# Patient Record
Sex: Female | Born: 1970
Health system: Southern US, Community
[De-identification: ages and names within clinical notes are randomized; demographics above are authoritative.]

## PROBLEM LIST (undated history)

## (undated) DIAGNOSIS — I10 Essential (primary) hypertension: Secondary | ICD-10-CM

## (undated) DIAGNOSIS — M4800 Spinal stenosis, site unspecified: Secondary | ICD-10-CM

## (undated) DIAGNOSIS — K589 Irritable bowel syndrome without diarrhea: Secondary | ICD-10-CM

## (undated) DIAGNOSIS — J45909 Unspecified asthma, uncomplicated: Secondary | ICD-10-CM

## (undated) DIAGNOSIS — K219 Gastro-esophageal reflux disease without esophagitis: Secondary | ICD-10-CM

## (undated) DIAGNOSIS — M1990 Unspecified osteoarthritis, unspecified site: Secondary | ICD-10-CM

## (undated) DIAGNOSIS — IMO0001 Reserved for inherently not codable concepts without codable children: Secondary | ICD-10-CM

## (undated) DIAGNOSIS — R091 Pleurisy: Secondary | ICD-10-CM

## (undated) HISTORY — DX: Gastro-esophageal reflux disease without esophagitis: K21.9

## (undated) HISTORY — DX: Reserved for inherently not codable concepts without codable children: IMO0001

## (undated) HISTORY — DX: Essential (primary) hypertension: I10

## (undated) HISTORY — PX: HERNIA REPAIR: SHX51

## (undated) HISTORY — DX: Unspecified asthma, uncomplicated: J45.909

---

## 2001-05-03 ENCOUNTER — Emergency Department (HOSPITAL_COMMUNITY): Admission: EM | Admit: 2001-05-03 | Discharge: 2001-05-03 | Payer: Self-pay | Admitting: *Deleted

## 2001-05-03 ENCOUNTER — Encounter: Payer: Self-pay | Admitting: *Deleted

## 2001-12-01 ENCOUNTER — Ambulatory Visit (HOSPITAL_COMMUNITY): Admission: RE | Admit: 2001-12-01 | Discharge: 2001-12-01 | Payer: Self-pay | Admitting: Family Medicine

## 2001-12-01 ENCOUNTER — Encounter: Payer: Self-pay | Admitting: Family Medicine

## 2002-12-05 ENCOUNTER — Ambulatory Visit (HOSPITAL_COMMUNITY): Admission: RE | Admit: 2002-12-05 | Discharge: 2002-12-05 | Payer: Self-pay | Admitting: Urology

## 2002-12-29 ENCOUNTER — Emergency Department (HOSPITAL_COMMUNITY): Admission: EM | Admit: 2002-12-29 | Discharge: 2002-12-29 | Payer: Self-pay | Admitting: Internal Medicine

## 2003-10-31 ENCOUNTER — Ambulatory Visit (HOSPITAL_COMMUNITY): Admission: RE | Admit: 2003-10-31 | Discharge: 2003-10-31 | Payer: Self-pay | Admitting: Family Medicine

## 2007-03-29 ENCOUNTER — Other Ambulatory Visit: Admission: RE | Admit: 2007-03-29 | Discharge: 2007-03-29 | Payer: Self-pay | Admitting: Obstetrics and Gynecology

## 2007-04-16 ENCOUNTER — Ambulatory Visit (HOSPITAL_COMMUNITY): Admission: RE | Admit: 2007-04-16 | Discharge: 2007-04-16 | Payer: Self-pay | Admitting: Family Medicine

## 2007-05-04 ENCOUNTER — Ambulatory Visit (HOSPITAL_COMMUNITY): Admission: RE | Admit: 2007-05-04 | Discharge: 2007-05-04 | Payer: Self-pay | Admitting: Family Medicine

## 2007-06-14 ENCOUNTER — Ambulatory Visit (HOSPITAL_COMMUNITY): Admission: RE | Admit: 2007-06-14 | Discharge: 2007-06-14 | Payer: Self-pay | Admitting: Family Medicine

## 2008-04-12 ENCOUNTER — Other Ambulatory Visit: Admission: RE | Admit: 2008-04-12 | Discharge: 2008-04-12 | Payer: Self-pay | Admitting: Obstetrics and Gynecology

## 2008-04-17 ENCOUNTER — Ambulatory Visit (HOSPITAL_COMMUNITY): Admission: RE | Admit: 2008-04-17 | Discharge: 2008-04-17 | Payer: Self-pay | Admitting: Obstetrics & Gynecology

## 2008-04-18 ENCOUNTER — Ambulatory Visit (HOSPITAL_COMMUNITY): Admission: RE | Admit: 2008-04-18 | Discharge: 2008-04-18 | Payer: Self-pay | Admitting: Obstetrics & Gynecology

## 2009-01-15 ENCOUNTER — Ambulatory Visit (HOSPITAL_COMMUNITY): Admission: RE | Admit: 2009-01-15 | Discharge: 2009-01-15 | Payer: Self-pay | Admitting: Family Medicine

## 2009-01-22 ENCOUNTER — Emergency Department (HOSPITAL_COMMUNITY): Admission: EM | Admit: 2009-01-22 | Discharge: 2009-01-22 | Payer: Self-pay | Admitting: Emergency Medicine

## 2009-05-23 ENCOUNTER — Other Ambulatory Visit: Admission: RE | Admit: 2009-05-23 | Discharge: 2009-05-23 | Payer: Self-pay | Admitting: Obstetrics & Gynecology

## 2010-01-03 ENCOUNTER — Ambulatory Visit (HOSPITAL_COMMUNITY)
Admission: RE | Admit: 2010-01-03 | Discharge: 2010-01-03 | Payer: Self-pay | Source: Home / Self Care | Admitting: Family Medicine

## 2010-01-15 ENCOUNTER — Ambulatory Visit (HOSPITAL_COMMUNITY)
Admission: RE | Admit: 2010-01-15 | Discharge: 2010-01-15 | Payer: Self-pay | Source: Home / Self Care | Attending: Internal Medicine | Admitting: Internal Medicine

## 2010-02-23 ENCOUNTER — Encounter: Payer: Self-pay | Admitting: Urology

## 2010-09-03 ENCOUNTER — Other Ambulatory Visit (HOSPITAL_COMMUNITY)
Admission: RE | Admit: 2010-09-03 | Discharge: 2010-09-03 | Disposition: A | Discharge status: Home / Self Care | Payer: BC Managed Care – PPO | Source: Ambulatory Visit | Attending: Obstetrics & Gynecology | Admitting: Obstetrics & Gynecology

## 2010-09-03 DIAGNOSIS — Z124 Encounter for screening for malignant neoplasm of cervix: Secondary | ICD-10-CM | POA: Insufficient documentation

## 2010-09-03 DIAGNOSIS — R87810 Cervical high risk human papillomavirus (HPV) DNA test positive: Secondary | ICD-10-CM | POA: Insufficient documentation

## 2010-12-20 IMAGING — CR DG CHEST 2V
2 series · 2 of 2 positions shown · non-contrast
Comparison: 01/22/2009

CLINICAL DATA: Cough, congestion, fever.

CHEST - 2 VIEW

[view not recorded (1 of 2)]
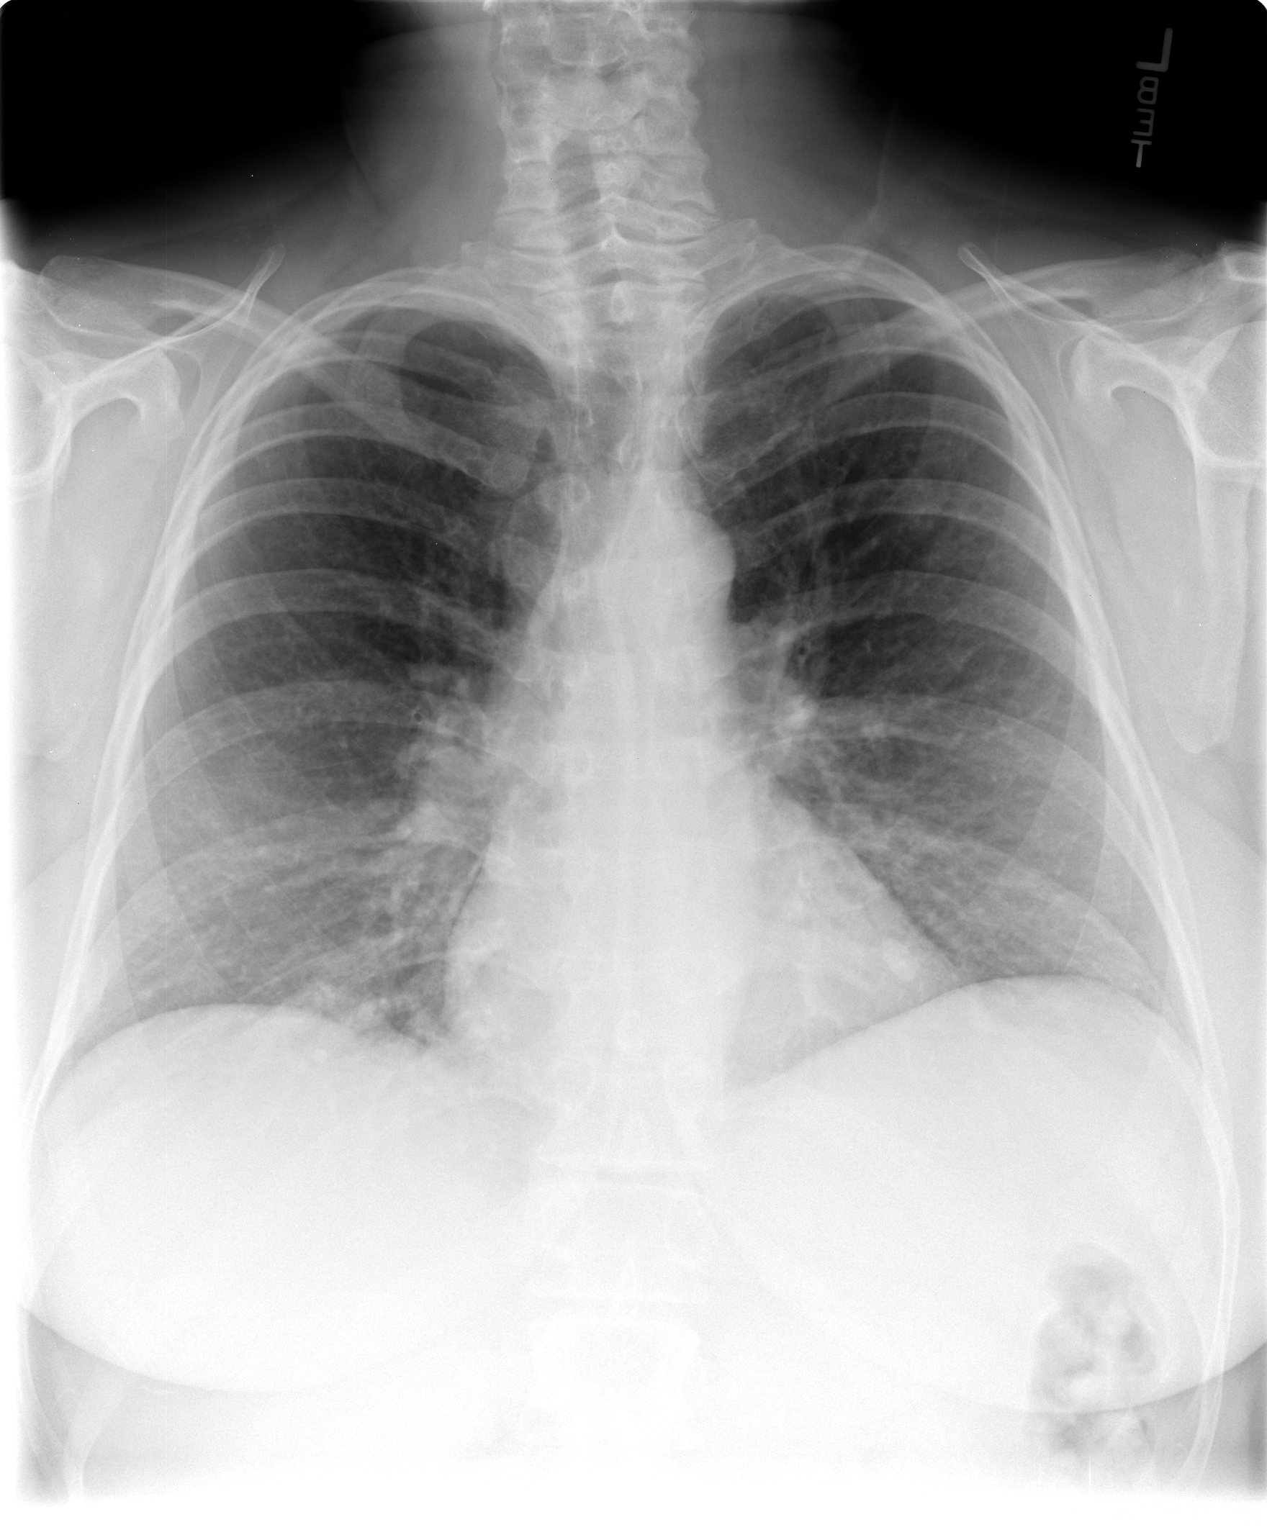

[view not recorded (2 of 2)]
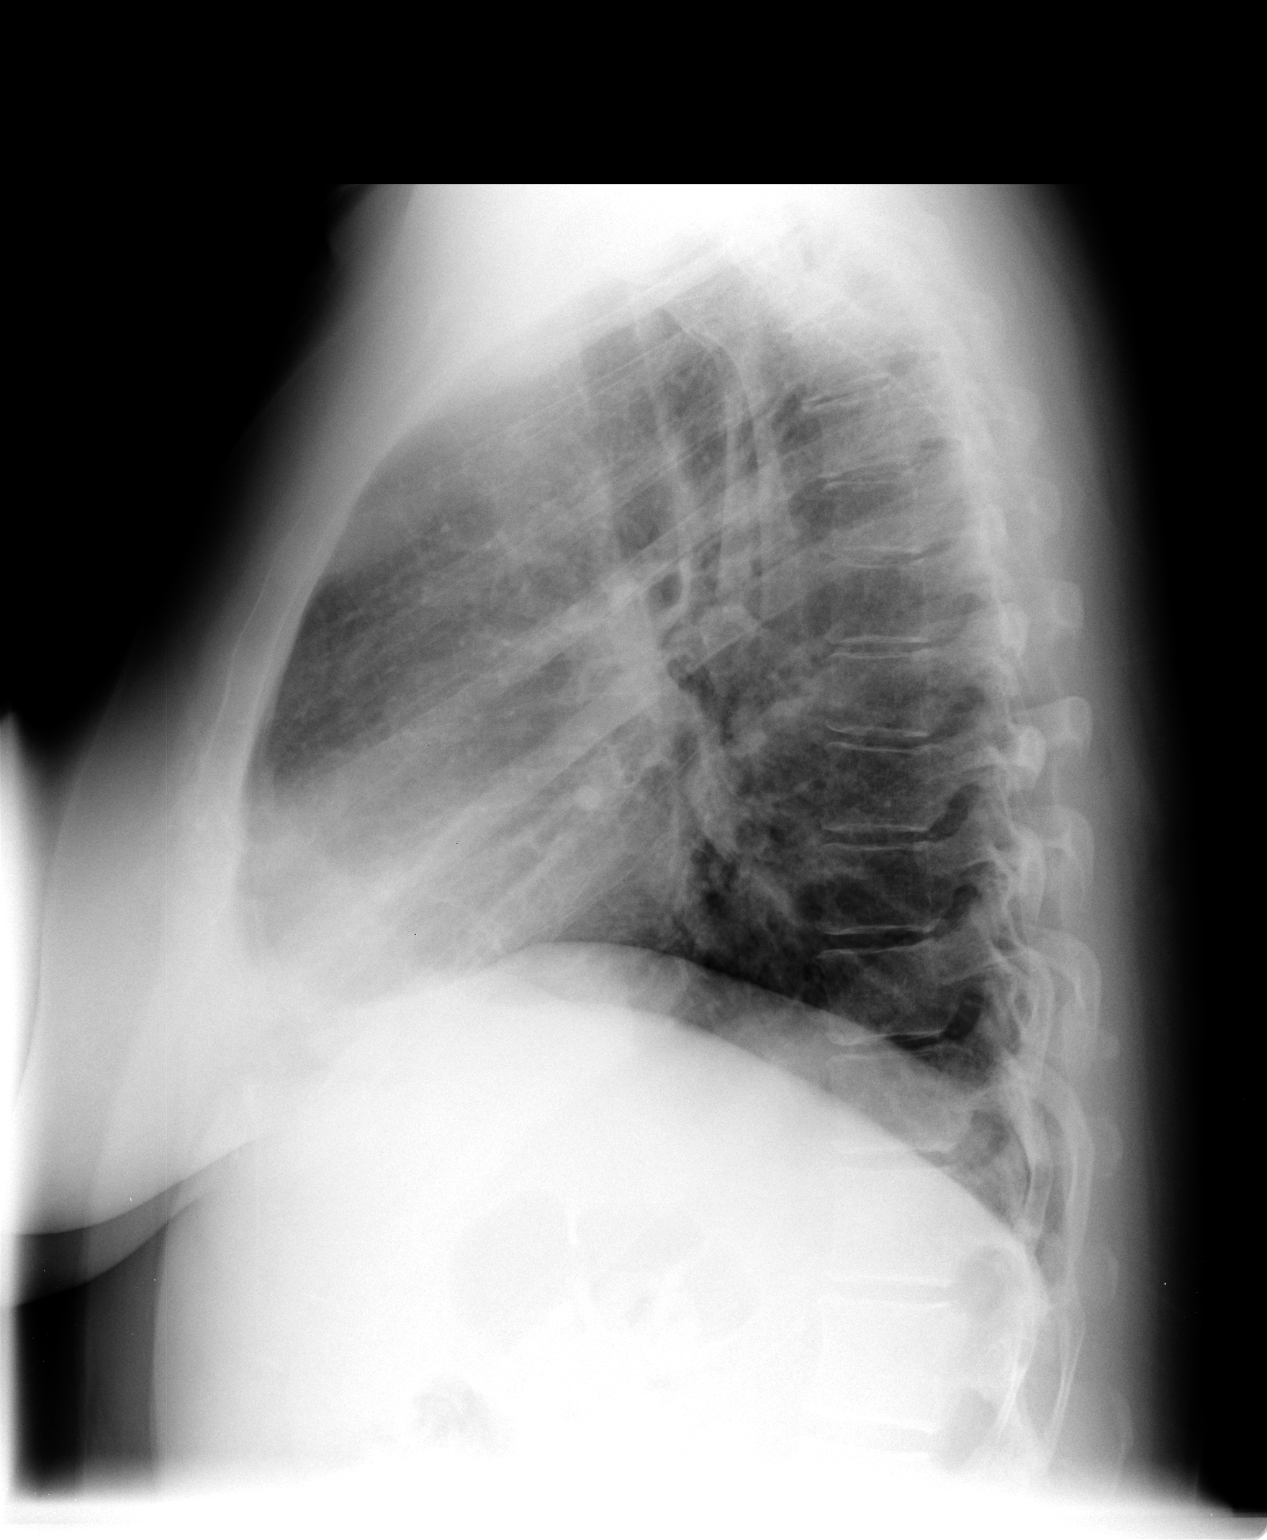

[2 of 2 positions shown; findings below may reference images not displayed]

FINDINGS: There is mild peribronchial thickening, similar to prior
study.  Heart and mediastinal contours are within normal limits.
No focal opacities or effusions.  No acute bony abnormality.
IMPRESSION: Mild bronchitic changes.

## 2011-09-15 ENCOUNTER — Other Ambulatory Visit (HOSPITAL_COMMUNITY): Payer: Self-pay | Admitting: Internal Medicine

## 2011-09-15 DIAGNOSIS — Z139 Encounter for screening, unspecified: Secondary | ICD-10-CM

## 2011-09-18 ENCOUNTER — Other Ambulatory Visit: Payer: Self-pay | Admitting: Obstetrics & Gynecology

## 2011-09-18 ENCOUNTER — Ambulatory Visit (HOSPITAL_COMMUNITY): Payer: BC Managed Care – PPO

## 2012-05-03 ENCOUNTER — Ambulatory Visit (INDEPENDENT_AMBULATORY_CARE_PROVIDER_SITE_OTHER): Payer: BC Managed Care – PPO | Admitting: Obstetrics & Gynecology

## 2012-05-03 ENCOUNTER — Encounter: Payer: Self-pay | Admitting: Obstetrics & Gynecology

## 2012-05-03 VITALS — BP 150/110 | Ht 66.0 in | Wt 193.0 lb

## 2012-05-03 DIAGNOSIS — Z01419 Encounter for gynecological examination (general) (routine) without abnormal findings: Secondary | ICD-10-CM

## 2012-05-03 DIAGNOSIS — R87810 Cervical high risk human papillomavirus (HPV) DNA test positive: Secondary | ICD-10-CM

## 2012-05-03 MED ORDER — NORETHINDRONE 0.35 MG PO TABS: 1.0000 | ORAL_TABLET | Freq: Every day | ORAL | Status: DC

## 2012-05-03 NOTE — Addendum Note (Signed)
Addended by: Lazaro Arms on: 05/03/2012 12:33 PM   Modules accepted: Orders

## 2012-05-03 NOTE — Progress Notes (Signed)
Patient ID: Ruth Dixon, female   DOB: [birth year], 41 y.o.   MRN: [id number] Subjective:     Ruth Dixon is a 42 y.o. female here for a routine exam.  Current complaints: none.  Personal health questionnaire reviewed: yes.   Gynecologic History Patient's last menstrual period was 04/27/2012. Contraception: oral progesterone-only contraceptive Last Pap: ASCUS +HPV . Results were: normal Last mammogram: 2010. Results were: normal  Obstetric History  G0 OB History   Grav Para Term Preterm Abortions TAB SAB Ect Mult Living                   The following portions of the patient's history were reviewed and updated as appropriate: allergies, current medications, past family history, past medical history, past social history, past surgical history and problem list.  Review of Systems    Review of Systems  Constitutional: Negative for fever, chills, weight loss, malaise/fatigue and diaphoresis.  HENT: Negative for hearing loss, ear pain, nosebleeds, congestion, sore throat, neck pain, tinnitus and ear discharge.   Eyes: Negative for blurred vision, double vision, photophobia, pain, discharge and redness.  Respiratory: Negative for cough, hemoptysis, sputum production, shortness of breath, wheezing and stridor.   Cardiovascular: Negative for chest pain, palpitations, orthopnea, claudication, leg swelling and PND.  Gastrointestinal: Negative for abdominal pain. Negative for heartburn, nausea, vomiting, diarrhea, constipation, blood in stool and melena.  Genitourinary: Negative for dysuria, urgency, frequency, hematuria and flank pain.  Musculoskeletal: Negative for myalgias, back pain, joint pain and falls.  Skin: Negative for itching and rash.  Neurological: Negative for dizziness, tingling, tremors, sensory change, speech change, focal weakness, seizures, loss of consciousness, weakness and headaches.  Endo/Heme/Allergies: Negative for environmental allergies and polydipsia. Does  not bruise/bleed easily.  Psychiatric/Behavioral: Negative for depression, suicidal ideas, hallucinations, memory loss and substance abuse. The patient is not nervous/anxious and does not have insomnia.       Objective:    BP 150/110  Ht 5\' 6"  (1.676 m)  Wt 193 lb (87.544 kg)  BMI 31.17 kg/m2  LMP 04/27/2012  General Appearance:    Alert, cooperative, no distress, appears stated age  Head:    Normocephalic, without obvious abnormality, atraumatic  Eyes:    PERRL, conjunctiva/corneas clear, EOM's intact, fundi    benign, both eyes  Ears:    Normal TM's and external ear canals, both ears  Nose:   Nares normal, septum midline, mucosa normal, no drainage    or sinus tenderness  Throat:   Lips, mucosa, and tongue normal; teeth and gums normal  Neck:   Supple, symmetrical, trachea midline, no adenopathy;    thyroid:  no enlargement/tenderness/nodules; no carotid   bruit or JVD  Back:     Symmetric, no curvature, ROM normal, no CVA tenderness  Lungs:     Clear to auscultation bilaterally, respirations unlabored  Chest Wall:    No tenderness or deformity   Heart:    Regular rate and rhythm, S1 and S2 normal, no murmur, rub   or gallop  Breast Exam:    No tenderness, masses, or nipple abnormality  Abdomen:     Soft, non-tender, bowel sounds active all four quadrants,    no masses, no organomegaly  Genitalia:    Normal female without lesion, discharge or tenderness  Rectal:    Normal tone, normal prostate, no masses or tenderness;   guaiac negative stool  Extremities:   Extremities normal, atraumatic, no cyanosis or edema  Pulses:   2+ and  symmetric all extremities  Skin:   Skin color, texture, turgor normal, no rashes or lesions  Lymph nodes:   Cervical, supraclavicular, and axillary nodes normal  Neurologic:   CNII-XII intact, normal strength, sensation and reflexes    throughout    vagina pink moist Uterus NSSC NT Ovaries WNL adnexa negative  Assessment:    Healthy female exam.     Plan:    Mammogram ordered. Follow up in: 1 year.

## 2012-05-03 NOTE — Patient Instructions (Signed)
Pelvic Exam A pelvic (gynecologic) exam is an exam of a woman's outer and inner genitals and reproductive organs. At age 44, or before a woman starts to have sexual intercourse, she should have her first pelvic exam. Pelvic exams allow your caregiver to check on normal development and screen for health problems. These exams should be done regularly throughout a woman's life. Usually, a general physical exam is done first. An exam of the breasts is also done. At this visit, you can ask questions about your health, body, menstrual cycles, sex, and birth control methods. Your caregiver will also ask you questions about your health, family health, menstrual periods, immunizations, and if you are sexually active. The information shared between you and your caregiver is kept confidential. REASONS FOR A PELVIC EXAM  Annual exam and Pap test. A Pap test removes cells from the cervix gently with a spatula and a small brush. The cells are tested for infection, precancer, and cancer.  A Pap test is done to screen for cervical cancer.  The first Pap test should be done at age 21.  Between ages 21 and 29, Pap tests are repeated every 2 years.  Beginning at age 30, you are advised to have a Pap test every 3 years as long as your past 3 Pap tests have been normal.  Some women have medical problems that increase the chance of getting cervical cancer. Talk to your caregiver about these problems. It is especially important to talk to your caregiver if a new problem develops soon after your last Pap test. In these cases, your caregiver may recommend more frequent screening and Pap tests.  The above recommendations are the same for women who have or have not gotten the vaccine for HPV (Human Papillomavirus).  If you had a hysterectomy for a problem that was not cancer or a condition that could lead to cancer, then you no longer need Pap tests. However, even if you no longer need a Pap test, a regular exam is a good  idea to make sure no other problems are starting.   If you are between ages 65 and 70, and you have had normal Pap tests going back 10 years, you no longer need Pap tests. However, even if you no longer need a Pap test, a regular exam is a good idea to make sure no other problems are starting.   If you have had past treatment for cervical cancer or a condition that could lead to cancer, you need Pap tests and screening for cancer for at least 20 years after your treatment.  If Pap tests have been discontinued, risk factors (such as a new sexual partner) need to be re-assessed to determine if screening should be resumed.  Some women may need screenings more often if they are at high risk for cervical cancer.  Make sure your female organs are normal and functioning correctly.  Evaluate a mass or other symptoms that suggest a reproductive system cancer.  Explore why you are not able to get pregnant (infertility).  Find a cause for vaginal discharge, itching, or burning.  Get certain types of birth control or start hormone therapy.  Look for causes of urinary incontinence or sexual problems.  Look for signs of sexually transmitted infection (STI).  Follow the progression of labor.  Determine if pregnancy is present or how far advanced the pregnancy is.  You have severe cramps during your menstrual period.  You have pain during sexual intercourse.  You have abnormal   menstrual periods.  You have no menstrual period by the age of 16. PROCEDURE   A pelvic exam is usually painless but may cause mild discomfort.  In unusual circumstances or in young girls, medicines may be used for comfort. A pelvic exam is not done routinely before a girl is sexually active. Special circumstances such as rape, trauma, or medical problems may require an exam.  You will remove all your clothes and will be given a gown. Usually, there is a nurse in the room during the exam and you can have someone  from your family with you also.  The general physical exam will be done first.  Before the pelvic exam starts, the woman lies down on her back on a special table. She puts the heels of her feet into foot rests (stirrups) with her legs apart. A gown, cloth, or paper drape is usually placed over her belly (abdomen) and legs. First, the caregiver checks the normal arrangement of body parts of the outer genitals. This includes the clitoris, vaginal opening, hymen, labia, and the perineal area between the vagina and rectum. The labia are the skin folds surrounding the vaginal opening. The tube that carries urine (urethra) is also examined.  An internal exam is done next. First, the caregiver inserts an instrument called a speculum into the vagina. The speculum has lubricant on it. The speculum helps hold the vaginal walls apart. The caregiver can then examine the vagina and cervix, which is the opening to the womb (uterus). Cultures of any discharge may be taken to check for an infection. A Pap test may be done.  After the internal exam is done, the speculum is removed. The caregiver uses latex gloves with a lubricant on the fingers to gently press against various pelvic organs from inside the vagina while the other hand is on the lower belly. The caregiver will note any tenderness or abnormalities.  If a pelvic exam is done on a woman who is thought to be in labor, her caregiver can check on the baby and how far her cervix has opened.  Following the exam, you will get dressed and can speak with your caregiver.  Ask your caregiver when and how often you should return for future visits. Finding out the results of your test Ask when your test results will be ready. Make sure you get your test results. TO HAVE A HEALTHY LIFESTYLE:  Follow your caregiver's advice regarding follow-up and future visits.  Get the necessary immunizations according to your age and any traveling you may do.  Eat a balanced,  nourishing diet.  Get plenty of rest and sleep.  Exercise regularly.  Maintain a healthy weight.  Do not smoke or take illegal drugs.  Drink alcohol in moderation or not at all.  If you are sexually active, use some form of birth control if you do not plan to get pregnant.  If you are sexually active, practice safe sex by using a condom to protect against sexually transmitted disease (STD).  Get help or counseling if you have emotional problems. Document Released: 04/12/2002 Document Revised: 04/14/2011 Document Reviewed: 04/18/2009 [employer] Patient Information 2013 [employer].  

## 2012-05-17 ENCOUNTER — Telehealth: Payer: Self-pay | Admitting: Obstetrics & Gynecology

## 2012-05-17 NOTE — Telephone Encounter (Signed)
Pt informed of WNL results from Pap on 05/03/2012

## 2012-05-17 NOTE — Telephone Encounter (Signed)
Left message to return call 

## 2013-06-08 ENCOUNTER — Other Ambulatory Visit: Payer: Self-pay | Admitting: Obstetrics & Gynecology

## 2013-12-06 ENCOUNTER — Ambulatory Visit (INDEPENDENT_AMBULATORY_CARE_PROVIDER_SITE_OTHER): Payer: BC Managed Care – PPO | Admitting: Urology

## 2013-12-06 DIAGNOSIS — R312 Other microscopic hematuria: Secondary | ICD-10-CM

## 2014-05-09 ENCOUNTER — Other Ambulatory Visit: Payer: Self-pay | Admitting: Obstetrics & Gynecology

## 2014-08-25 ENCOUNTER — Other Ambulatory Visit (HOSPITAL_COMMUNITY): Payer: Self-pay | Admitting: Physician Assistant

## 2014-08-25 DIAGNOSIS — Z1231 Encounter for screening mammogram for malignant neoplasm of breast: Secondary | ICD-10-CM

## 2014-09-11 ENCOUNTER — Ambulatory Visit (HOSPITAL_COMMUNITY): Payer: Self-pay

## 2014-12-19 ENCOUNTER — Emergency Department (HOSPITAL_COMMUNITY): Payer: BLUE CROSS/BLUE SHIELD

## 2014-12-19 ENCOUNTER — Encounter (HOSPITAL_COMMUNITY): Payer: Self-pay | Admitting: Emergency Medicine

## 2014-12-19 ENCOUNTER — Emergency Department (HOSPITAL_COMMUNITY)
Admission: EM | Admit: 2014-12-19 | Discharge: 2014-12-19 | Disposition: A | Discharge status: Home / Self Care | Payer: BLUE CROSS/BLUE SHIELD | Attending: Emergency Medicine | Admitting: Emergency Medicine

## 2014-12-19 DIAGNOSIS — F17210 Nicotine dependence, cigarettes, uncomplicated: Secondary | ICD-10-CM | POA: Insufficient documentation

## 2014-12-19 DIAGNOSIS — Z3202 Encounter for pregnancy test, result negative: Secondary | ICD-10-CM | POA: Diagnosis not present

## 2014-12-19 DIAGNOSIS — R091 Pleurisy: Secondary | ICD-10-CM | POA: Diagnosis not present

## 2014-12-19 DIAGNOSIS — R109 Unspecified abdominal pain: Secondary | ICD-10-CM | POA: Diagnosis present

## 2014-12-19 DIAGNOSIS — Z79899 Other long term (current) drug therapy: Secondary | ICD-10-CM | POA: Diagnosis not present

## 2014-12-19 DIAGNOSIS — J45909 Unspecified asthma, uncomplicated: Secondary | ICD-10-CM | POA: Diagnosis not present

## 2014-12-19 DIAGNOSIS — K219 Gastro-esophageal reflux disease without esophagitis: Secondary | ICD-10-CM | POA: Diagnosis not present

## 2014-12-19 DIAGNOSIS — J40 Bronchitis, not specified as acute or chronic: Secondary | ICD-10-CM

## 2014-12-19 DIAGNOSIS — I10 Essential (primary) hypertension: Secondary | ICD-10-CM | POA: Insufficient documentation

## 2014-12-19 DIAGNOSIS — R52 Pain, unspecified: Secondary | ICD-10-CM

## 2014-12-19 LAB — COMPREHENSIVE METABOLIC PANEL
ALT: 23 U/L (ref 14–54)
AST: 26 U/L (ref 15–41)
Albumin: 4 g/dL (ref 3.5–5.0)
Alkaline Phosphatase: 42 U/L (ref 38–126)
Anion gap: 9 (ref 5–15)
BUN: 10 mg/dL (ref 6–20)
CO2: 25 mmol/L (ref 22–32)
Calcium: 9.2 mg/dL (ref 8.9–10.3)
Chloride: 103 mmol/L (ref 101–111)
Creatinine, Ser: 0.53 mg/dL (ref 0.44–1.00)
GFR calc Af Amer: >60 mL/min (ref >60)
GFR calc non Af Amer: >60 mL/min (ref >60)
Glucose, Bld: 109 mg/dL — ABNORMAL HIGH (ref 65–99)
Potassium: 4.6 mmol/L (ref 3.5–5.1)
Sodium: 137 mmol/L (ref 135–145)
Total Bilirubin: 0.7 mg/dL (ref 0.3–1.2)
Total Protein: 7.3 g/dL (ref 6.5–8.1)

## 2014-12-19 LAB — URINALYSIS, ROUTINE W REFLEX MICROSCOPIC
Bilirubin Urine: NEGATIVE
Glucose, UA: NEGATIVE mg/dL
Ketones, ur: NEGATIVE mg/dL
Leukocytes, UA: NEGATIVE
Nitrite: NEGATIVE
Protein, ur: NEGATIVE mg/dL
Specific Gravity, Urine: 1.015 (ref 1.005–1.030)
Urobilinogen, UA: 0.2 mg/dL (ref 0.0–1.0)
pH: 8 (ref 5.0–8.0)

## 2014-12-19 LAB — CBC WITH DIFFERENTIAL/PLATELET
Basophils Absolute: 0 10*3/uL (ref 0.0–0.1)
Basophils Relative: 0 %
Eosinophils Absolute: 0.1 10*3/uL (ref 0.0–0.7)
Eosinophils Relative: 1 %
HCT: 42.4 % (ref 36.0–46.0)
Hemoglobin: 13.8 g/dL (ref 12.0–15.0)
Lymphocytes Relative: 30 %
Lymphs Abs: 3 10*3/uL (ref 0.7–4.0)
MCH: 28.7 pg (ref 26.0–34.0)
MCHC: 32.5 g/dL (ref 30.0–36.0)
MCV: 88.1 fL (ref 78.0–100.0)
Monocytes Absolute: 1 10*3/uL (ref 0.1–1.0)
Monocytes Relative: 10 %
Neutro Abs: 5.7 10*3/uL (ref 1.7–7.7)
Neutrophils Relative %: 59 %
Platelets: 434 10*3/uL — ABNORMAL HIGH (ref 150–400)
RBC: 4.81 MIL/uL (ref 3.87–5.11)
RDW: 13.7 % (ref 11.5–15.5)
WBC: 10 10*3/uL (ref 4.0–10.5)

## 2014-12-19 LAB — D-DIMER, QUANTITATIVE: D-Dimer, Quant: 0.34 ug/mL-FEU (ref 0.00–0.50)

## 2014-12-19 LAB — I-STAT TROPONIN, ED
TROPONIN I, POC: 0 ng/mL (ref 0.00–0.08)
TROPONIN I, POC: 0 ng/mL (ref 0.00–0.08)

## 2014-12-19 LAB — URINE MICROSCOPIC-ADD ON
Bacteria, UA: NONE SEEN
WBC, UA: NONE SEEN WBC/hpf (ref 0–5)

## 2014-12-19 LAB — POC URINE PREG, ED: PREG TEST UR: NEGATIVE

## 2014-12-19 MED ORDER — AZITHROMYCIN 250 MG PO TABS: ORAL_TABLET | ORAL | Status: DC

## 2014-12-19 MED ORDER — HYDROMORPHONE HCL 1 MG/ML IJ SOLN
1.0000 mg | Freq: Once | INTRAMUSCULAR | Status: AC
  Administered 2014-12-19: 1 mg via INTRAVENOUS
  Filled 2014-12-19: qty 1

## 2014-12-19 MED ORDER — NAPROXEN 500 MG PO TABS
500.0000 mg | ORAL_TABLET | Freq: Two times a day (BID) | ORAL | Status: DC
Start: 2014-12-19 — End: 2015-05-26

## 2014-12-19 MED ORDER — ONDANSETRON HCL 4 MG/2ML IJ SOLN
4.0000 mg | Freq: Once | INTRAMUSCULAR | Status: AC
  Administered 2014-12-19: 4 mg via INTRAVENOUS
  Filled 2014-12-19: qty 2

## 2014-12-19 MED ORDER — IOHEXOL 350 MG/ML SOLN
100.0000 mL | Freq: Once | INTRAVENOUS | Status: AC | PRN
  Administered 2014-12-19: 100 mL via INTRAVENOUS

## 2014-12-19 MED ORDER — OXYCODONE-ACETAMINOPHEN 5-325 MG PO TABS: 1.0000 | ORAL_TABLET | Freq: Four times a day (QID) | ORAL | Status: DC | PRN

## 2014-12-19 NOTE — ED Notes (Addendum)
Pt states "left flank pain" started Thursday, has progressively gotten worse. Pt denies hx of kidney stones. Pt denies N/V/, and urinary sx. This morning pt states urination provided relief. Pt is holding left rib area under breast and states that is where the pain is. Pt denies pain in back. Pt states she took two 10-325 Hydrocodone at home without relief.

## 2014-12-19 NOTE — ED Provider Notes (Signed)
CSN: [id number]     Arrival date & time 12/19/14  [id number] History  By signing my name below, I, Ruth Dixon, attest that this documentation has been prepared under the direction and in the presence of Bethann BerkshireJoseph Amiree No, MD. Electronically Signed: Marica OtterNusrat Dixon, ED Scribe. 12/19/2014. 9:04 AM.  Chief Complaint  Patient presents with  . Flank Pain   Patient is a 44 y.o. female presenting with flank pain. The history is provided by the patient. No language interpreter was used.  Flank Pain This is a new problem. The current episode started more than 2 days ago. The problem occurs constantly. The problem has been gradually worsening. Pertinent negatives include no chest pain, no abdominal pain and no headaches.   PCP: Colette RibasGOLDING, JOHN CABOT, MD HPI Comments: Ruth Griffonngela W Dixon is a 44 y.o. female, with PMHx noted below, who presents to the Emergency Department complaining of worsening, severe, stabbing, left flank pain originally onset 5 days ago and worsening in the last 24 hours. Pt denies Hx of kidney stones. Pt notes temporary relief after voiding this morning. Pt further denies back pain, nausea, vomiting or any urinary Sx.   Past Medical History  Diagnosis Date  . Reflux   . Asthma   . Hypertension    Past Surgical History  Procedure Laterality Date  . Hernia repair     Family History  Problem Relation Age of Onset  . Diabetes Maternal Grandmother   . COPD Maternal Grandmother   . Stroke Maternal Grandmother   . COPD Maternal Grandfather   . COPD Paternal Grandmother   . COPD Paternal Grandfather    Social History  Substance Use Topics  . Smoking status: Current Every Day Smoker -- 1.00 packs/day    Types: Cigarettes  . Smokeless tobacco: None  . Alcohol Use: Yes     Comment: occ wine for supper   OB History    No data available     Review of Systems  Constitutional: Negative for appetite change and fatigue.  HENT: Negative for congestion, ear discharge and sinus pressure.    Eyes: Negative for discharge.  Respiratory: Negative for cough.   Cardiovascular: Negative for chest pain.  Gastrointestinal: Negative for nausea, vomiting, abdominal pain and diarrhea.  Genitourinary: Positive for flank pain. Negative for dysuria, frequency, hematuria and difficulty urinating.  Musculoskeletal: Negative for back pain.  Skin: Negative for rash.  Neurological: Negative for seizures and headaches.  Psychiatric/Behavioral: Negative for hallucinations.   Allergies  Review of patient's allergies indicates no known allergies.  Home Medications   Prior to Admission medications   Medication Sig Start Date End Date Taking? Authorizing Provider  amLODipine (NORVASC) 10 MG tablet Take by mouth daily. 04/11/12   Historical Provider, MD  ERRIN 0.35 MG tablet TAKE AS DIRECTED BY DOCTOR 05/09/14   Lazaro ArmsLuther H Eure, MD  fish oil-omega-3 fatty acids 1000 MG capsule Take 2 g by mouth daily.    Historical Provider, MD  FLUoxetine (PROZAC) 40 MG capsule Take 40 mg by mouth daily.    Historical Provider, MD  HYDROcodone-acetaminophen (NORCO) 10-325 MG per tablet daily. 04/12/12   Historical Provider, MD  lansoprazole (PREVACID) 15 MG capsule Take 15 mg by mouth daily.    Historical Provider, MD  lisinopril (PRINIVIL,ZESTRIL) 10 MG tablet daily. 04/11/12   Historical Provider, MD   Triage Vitals: BP 144/102 mmHg  Pulse 108  Temp(Src) 98.2 F (36.8 C) (Oral)  Resp 20  Ht 5\' 7"  (1.702 m)  Wt 200 lb (90.719  kg)  BMI 31.32 kg/m2  SpO2 92%  LMP 07/19/2014 Physical Exam  Constitutional: She is oriented to person, place, and time. She appears well-developed.  HENT:  Head: Normocephalic.  Eyes: Conjunctivae and EOM are normal. No scleral icterus.  Neck: Neck supple. No thyromegaly present.  Cardiovascular: Normal rate and regular rhythm.  Exam reveals no gallop and no friction rub.   No murmur heard. Pulmonary/Chest: No stridor. She has no wheezes. She has no rales. She exhibits tenderness.   Tender to anterior left lower chest  Abdominal: She exhibits no distension. There is no tenderness. There is no rebound.  Musculoskeletal: Normal range of motion. She exhibits no edema.  Lymphadenopathy:    She has no cervical adenopathy.  Neurological: She is oriented to person, place, and time. She exhibits normal muscle tone. Coordination normal.  Skin: No rash noted. No erythema.  Psychiatric: She has a normal mood and affect. Her behavior is normal.    ED Course  Procedures (including critical care time) DIAGNOSTIC STUDIES: Oxygen Saturation is 92% on ra, low by my interpretation.    COORDINATION OF CARE: 9:01 AM: Discussed treatment plan which includes labs and meds with pt at bedside; patient verbalizes understanding and agrees with treatment plan.  Labs Review Labs Reviewed - No data to display I have personally reviewed and evaluated these lab results as part of my medical decision-making.  MDM   Final diagnoses:  None   CT chest CT abdomen negative chest x-ray shows bronchitis labs unremarkable. Suspect bronchitis with severe pleurisy we'll treat with Naprosyn Percocet and a Z-Pak patient is follow-up Thursday with her M.D.  The chart was scribed for me under my direct supervision.  I personally performed the history, physical, and medical decision making and all procedures in the evaluation of this patient.Bethann Berkshire, MD 12/19/14 [phone]

## 2014-12-19 NOTE — Discharge Instructions (Signed)
Follow up with your md Thursday as planned

## 2014-12-19 NOTE — ED Notes (Signed)
MD at bedside. 

## 2015-04-27 ENCOUNTER — Other Ambulatory Visit: Payer: Self-pay | Admitting: Obstetrics & Gynecology

## 2015-05-26 ENCOUNTER — Encounter (HOSPITAL_COMMUNITY): Payer: Self-pay | Admitting: Emergency Medicine

## 2015-05-26 ENCOUNTER — Emergency Department (HOSPITAL_COMMUNITY)
Admission: EM | Admit: 2015-05-26 | Discharge: 2015-05-26 | Disposition: A | Discharge status: Home / Self Care | Payer: BLUE CROSS/BLUE SHIELD | Attending: Emergency Medicine | Admitting: Emergency Medicine

## 2015-05-26 ENCOUNTER — Emergency Department (HOSPITAL_COMMUNITY): Payer: BLUE CROSS/BLUE SHIELD

## 2015-05-26 DIAGNOSIS — F17210 Nicotine dependence, cigarettes, uncomplicated: Secondary | ICD-10-CM | POA: Insufficient documentation

## 2015-05-26 DIAGNOSIS — R112 Nausea with vomiting, unspecified: Secondary | ICD-10-CM | POA: Diagnosis not present

## 2015-05-26 DIAGNOSIS — Z79899 Other long term (current) drug therapy: Secondary | ICD-10-CM | POA: Insufficient documentation

## 2015-05-26 DIAGNOSIS — I10 Essential (primary) hypertension: Secondary | ICD-10-CM | POA: Insufficient documentation

## 2015-05-26 DIAGNOSIS — R197 Diarrhea, unspecified: Secondary | ICD-10-CM | POA: Diagnosis not present

## 2015-05-26 LAB — COMPREHENSIVE METABOLIC PANEL
ALK PHOS: 49 U/L (ref 38–126)
ALT: 29 U/L (ref 14–54)
AST: 29 U/L (ref 15–41)
Albumin: 4.6 g/dL (ref 3.5–5.0)
Anion gap: 16 — ABNORMAL HIGH (ref 5–15)
BUN: 14 mg/dL (ref 6–20)
CALCIUM: 9.8 mg/dL (ref 8.9–10.3)
CHLORIDE: 100 mmol/L — AB (ref 101–111)
CO2: 21 mmol/L — AB (ref 22–32)
Creatinine, Ser: 0.71 mg/dL (ref 0.44–1.00)
GFR calc non Af Amer: >60 mL/min (ref >60)
GLUCOSE: 137 mg/dL — AB (ref 65–99)
Potassium: 3.7 mmol/L (ref 3.5–5.1)
SODIUM: 137 mmol/L (ref 135–145)
Total Bilirubin: 1.2 mg/dL (ref 0.3–1.2)
Total Protein: 8.3 g/dL — ABNORMAL HIGH (ref 6.5–8.1)

## 2015-05-26 LAB — CBC
HEMATOCRIT: 47.8 % — AB (ref 36.0–46.0)
HEMOGLOBIN: 16.3 g/dL — AB (ref 12.0–15.0)
MCH: 29 pg (ref 26.0–34.0)
MCHC: 34.1 g/dL (ref 30.0–36.0)
MCV: 85.1 fL (ref 78.0–100.0)
Platelets: 414 10*3/uL — ABNORMAL HIGH (ref 150–400)
RBC: 5.62 MIL/uL — AB (ref 3.87–5.11)
RDW: 14.2 % (ref 11.5–15.5)
WBC: 16.2 10*3/uL — ABNORMAL HIGH (ref 4.0–10.5)

## 2015-05-26 LAB — URINALYSIS, ROUTINE W REFLEX MICROSCOPIC
Glucose, UA: NEGATIVE mg/dL
Leukocytes, UA: NEGATIVE
Nitrite: NEGATIVE
PH: 6 (ref 5.0–8.0)
Protein, ur: 30 mg/dL — AB
SPECIFIC GRAVITY, URINE: 1.01 (ref 1.005–1.030)

## 2015-05-26 LAB — URINE MICROSCOPIC-ADD ON

## 2015-05-26 LAB — PREGNANCY, URINE: PREG TEST UR: NEGATIVE

## 2015-05-26 LAB — LIPASE, BLOOD: Lipase: 22 U/L (ref 11–51)

## 2015-05-26 MED ORDER — DICYCLOMINE HCL 20 MG PO TABS: 20.0000 mg | ORAL_TABLET | Freq: Four times a day (QID) | ORAL | Status: DC | PRN

## 2015-05-26 MED ORDER — DICYCLOMINE HCL 10 MG/ML IM SOLN
20.0000 mg | Freq: Once | INTRAMUSCULAR | Status: AC
  Administered 2015-05-26: 20 mg via INTRAMUSCULAR
  Filled 2015-05-26: qty 2

## 2015-05-26 MED ORDER — ONDANSETRON 4 MG PO TBDP: 4.0000 mg | ORAL_TABLET | Freq: Three times a day (TID) | ORAL | Status: DC | PRN

## 2015-05-26 MED ORDER — FAMOTIDINE IN NACL 20-0.9 MG/50ML-% IV SOLN
20.0000 mg | Freq: Once | INTRAVENOUS | Status: AC
  Administered 2015-05-26: 20 mg via INTRAVENOUS
  Filled 2015-05-26: qty 50

## 2015-05-26 MED ORDER — SODIUM CHLORIDE 0.9 % IV SOLN: INTRAVENOUS | Status: DC

## 2015-05-26 MED ORDER — ONDANSETRON HCL 4 MG/2ML IJ SOLN
4.0000 mg | INTRAMUSCULAR | Status: DC | PRN
  Administered 2015-05-26: 4 mg via INTRAVENOUS
  Filled 2015-05-26: qty 2

## 2015-05-26 MED ORDER — SODIUM CHLORIDE 0.9 % IV BOLUS (SEPSIS)
1000.0000 mL | Freq: Once | INTRAVENOUS | Status: AC
  Administered 2015-05-26: 1000 mL via INTRAVENOUS

## 2015-05-26 NOTE — ED Notes (Addendum)
Pt states she feeling much better.Does not want abd xray.  Requesting to be discharged. Dr Richrd PrimeMcmannus notified and instructed to give  Fluid challenge  Patient given water to see if she tolerates po fluids

## 2015-05-26 NOTE — ED Notes (Signed)
Pt tolerated po fluids. No further vomitng

## 2015-05-26 NOTE — ED Provider Notes (Signed)
CSN: [id number]     Arrival date & time 05/26/15  1502 History   First MD Initiated Contact with Patient 05/26/15 1523     Chief Complaint  Patient presents with  . Emesis     HPI  Pt was seen at 1535. Per pt, c/o gradual onset and persistence of multiple intermittent episodes of N/V/D that began overnight last night approximately 0230. Describes the stools as "watery." Has been associated with generalized abd "cramping." States multiple others at work with similar symptoms. Denies abd pain, no CP/SOB, no back pain, no fevers, no black or blood in stools or emesis.    Past Medical History  Diagnosis Date  . Reflux   . Asthma   . Hypertension    Past Surgical History  Procedure Laterality Date  . Hernia repair     Family History  Problem Relation Age of Onset  . Diabetes Maternal Grandmother   . COPD Maternal Grandmother   . Stroke Maternal Grandmother   . COPD Maternal Grandfather   . COPD Paternal Grandmother   . COPD Paternal Grandfather    Social History  Substance Use Topics  . Smoking status: Current Every Day Smoker -- 1.00 packs/day    Types: Cigarettes  . Smokeless tobacco: None  . Alcohol Use: Yes     Comment: occ wine for supper    Review of Systems ROS: Statement: All systems negative except as marked or noted in the HPI; Constitutional: Negative for fever and chills. ; ; Eyes: Negative for eye pain, redness and discharge. ; ; ENMT: Negative for ear pain, hoarseness, nasal congestion, sinus pressure and sore throat. ; ; Cardiovascular: Negative for chest pain, palpitations, diaphoresis, dyspnea and peripheral edema. ; ; Respiratory: Negative for cough, wheezing and stridor. ; ; Gastrointestinal: +N/V/D, abd cramping. Negative for abdominal pain, blood in stool, hematemesis, jaundice and rectal bleeding. . ; ; Genitourinary: Negative for dysuria, flank pain and hematuria. ; ; Musculoskeletal: Negative for back pain and neck pain. Negative for swelling and trauma.; ;  Skin: Negative for pruritus, rash, abrasions, blisters, bruising and skin lesion.; ; Neuro: Negative for headache, lightheadedness and neck stiffness. Negative for weakness, altered level of consciousness , altered mental status, extremity weakness, paresthesias, involuntary movement, seizure and syncope.      Allergies  Review of patient's allergies indicates no known allergies.  Home Medications   Prior to Admission medications   Medication Sig Start Date End Date Taking? Authorizing Provider  amLODipine (NORVASC) 10 MG tablet Take 10 mg by mouth daily.  04/11/12   Historical Provider, MD  azithromycin (ZITHROMAX Z-PAK) 250 MG tablet 2 po day one, then 1 daily x 4 days 12/19/14   Bethann Berkshire, MD  ERRIN 0.35 MG tablet TAKE AS DIRECTED BY DOCTOR 04/27/15   Lazaro Arms, MD  fish oil-omega-3 fatty acids 1000 MG capsule Take 2 g by mouth daily.    Historical Provider, MD  FLUoxetine (PROZAC) 40 MG capsule Take 40 mg by mouth daily.    Historical Provider, MD  HYDROcodone-acetaminophen (NORCO) 10-325 MG per tablet Take 1-2 tablets by mouth every 6 (six) hours as needed for moderate pain.  04/12/12   Historical Provider, MD  lansoprazole (PREVACID) 15 MG capsule Take 15 mg by mouth daily.    Historical Provider, MD  lisinopril (PRINIVIL,ZESTRIL) 10 MG tablet Take 10 mg by mouth daily.  04/11/12   Historical Provider, MD  naproxen (NAPROSYN) 500 MG tablet Take 1 tablet (500 mg total) by mouth 2 (  two) times daily. 12/19/14   Bethann Berkshire, MD  oxyCODONE-acetaminophen (PERCOCET/ROXICET) 5-325 MG tablet Take 1 tablet by mouth every 6 (six) hours as needed. 12/19/14   Bethann Berkshire, MD   BP 114/96 mmHg  Pulse 122  Temp(Src) 96.7 F (35.9 C) (Temporal)  Resp 20  Ht  (1.702 m)  Wt 204 lb (92.534 kg)  BMI 31.94 kg/m2  SpO2 100%  LMP 04/21/2015 Physical Exam  1540: Physical examination:  Nursing notes reviewed; Vital signs and O2 SAT reviewed;  Constitutional: Well developed, Well nourished,  In no acute distress; Head:  Normocephalic, atraumatic; Eyes: EOMI, PERRL, No scleral icterus; ENMT: Mouth and pharynx normal, Mucous membranes dry; Neck: Supple, Full range of motion, No lymphadenopathy; Cardiovascular: Tachycardic rate and rhythm, No murmur, rub, or gallop; Respiratory: Breath sounds clear & equal bilaterally, No rales, rhonchi, wheezes.  Speaking full sentences with ease, Normal respiratory effort/excursion; Chest: Nontender, Movement normal; Abdomen: Soft, +mild diffuse tenderness to palp. No rebound or guarding. Nondistended, Normal bowel sounds; Genitourinary: No CVA tenderness; Extremities: Pulses normal, No tenderness, No edema, No calf edema or asymmetry.; Neuro: AA&Ox3, Major CN grossly intact.  Speech clear. No gross focal motor or sensory deficits in extremities.; Skin: Color normal, Warm, Dry.   ED Course  Procedures (including critical care time) Labs Review  Imaging Review  I have personally reviewed and evaluated these images and lab results as part of my medical decision-making.   EKG Interpretation None      MDM  MDM Reviewed: previous chart, nursing note and vitals Reviewed previous: labs Interpretation: labs     Results for orders placed or performed during the hospital encounter of 05/26/15  Lipase, blood  Result Value Ref Range   Lipase 22 11 - 51 U/L  Comprehensive metabolic panel  Result Value Ref Range   Sodium 137 135 - 145 mmol/L   Potassium 3.7 3.5 - 5.1 mmol/L   Chloride 100 (L) 101 - 111 mmol/L   CO2 21 (L) 22 - 32 mmol/L   Glucose, Bld 137 (H) 65 - 99 mg/dL   BUN 14 6 - 20 mg/dL   Creatinine, Ser 1.61 0.44 - 1.00 mg/dL   Calcium 9.8 8.9 - 09.6 mg/dL   Total Protein 8.3 (H) 6.5 - 8.1 g/dL   Albumin 4.6 3.5 - 5.0 g/dL   AST 29 15 - 41 U/L   ALT 29 14 - 54 U/L   Alkaline Phosphatase 49 38 - 126 U/L   Total Bilirubin 1.2 0.3 - 1.2 mg/dL   GFR calc non Af Amer >60 >60 mL/min   GFR calc Af Amer >60 >60 mL/min   Anion gap 16 (H) 5  - 15  CBC  Result Value Ref Range   WBC 16.2 (H) 4.0 - 10.5 K/uL   RBC 5.62 (H) 3.87 - 5.11 MIL/uL   Hemoglobin 16.3 (H) 12.0 - 15.0 g/dL   HCT 04.5 (H) 40.9 - 81.1 %   MCV 85.1 78.0 - 100.0 fL   MCH 29.0 26.0 - 34.0 pg   MCHC 34.1 30.0 - 36.0 g/dL   RDW 91.4 78.2 - 95.6 %   Platelets 414 (H) 150 - 400 K/uL  Urinalysis, Routine w reflex microscopic (not at [facility])  Result Value Ref Range   Color, Urine YELLOW YELLOW   APPearance HAZY (A) CLEAR   Specific Gravity, Urine 1.010 1.005 - 1.030   pH 6.0 5.0 - 8.0   Glucose, UA NEGATIVE NEGATIVE mg/dL   Hgb urine dipstick  LARGE (A) NEGATIVE   Bilirubin Urine SMALL (A) NEGATIVE   Ketones, ur TRACE (A) NEGATIVE mg/dL   Protein, ur 30 (A) NEGATIVE mg/dL   Nitrite NEGATIVE NEGATIVE   Leukocytes, UA NEGATIVE NEGATIVE  Pregnancy, urine  Result Value Ref Range   Preg Test, Ur NEGATIVE NEGATIVE  Urine microscopic-add on  Result Value Ref Range   Squamous Epithelial / LPF 6-30 (A) NONE SEEN   WBC, UA 0-5 0 - 5 WBC/hpf   RBC / HPF 6-30 0 - 5 RBC/hpf   Bacteria, UA MANY (A) NONE SEEN    1755:  Udip appears contaminated, and pt denies dysuria. Pt has tol PO well while in the ED without N/V.  No stooling while in the ED. Pt has ambulated with steady gait, easy resps, NAD. Abd benign, VSS. Pt states she feels better, has gotten herself dressed, and wants to go home now. Refuses AXR or any other testing/treatment in the ED. Dx and testing d/w pt and family.  Questions answered.  Verb understanding, agreeable to d/c home with outpt f/u.      Samuel JesterKathleen Ireoluwa Gorsline, DO 05/30/15 1455

## 2015-05-26 NOTE — Discharge Instructions (Signed)
Take the prescriptions as directed.  Increase your fluid intake (ie:  Gatoraide) for the next few days, as discussed.  Eat a bland diet and advance to your regular diet slowly as you can tolerate it.   Avoid full strength juices, as well as milk and milk products until your diarrhea has resolved.   Call your regular medical doctor Monday to schedule a follow up appointment in the next 2 days.  Return to the Emergency Department immediately if not improving (or even worsening) despite taking the medicines as prescribed, any black or bloody stool or vomit, if you develop a fever over "101," or for any other concerns.

## 2015-05-26 NOTE — ED Notes (Signed)
Pt states she has been vomiting with diarrhea since about 0230 this morning.

## 2017-01-21 ENCOUNTER — Other Ambulatory Visit (HOSPITAL_COMMUNITY): Payer: Self-pay | Admitting: Family Medicine

## 2017-01-21 ENCOUNTER — Ambulatory Visit (HOSPITAL_COMMUNITY)
Admission: RE | Admit: 2017-01-21 | Discharge: 2017-01-21 | Disposition: A | Discharge status: Home / Self Care | Payer: Managed Care, Other (non HMO) | Source: Ambulatory Visit | Attending: Family Medicine | Admitting: Family Medicine

## 2017-01-21 DIAGNOSIS — R0600 Dyspnea, unspecified: Secondary | ICD-10-CM | POA: Diagnosis not present

## 2017-03-21 ENCOUNTER — Emergency Department (HOSPITAL_COMMUNITY)
Admission: EM | Admit: 2017-03-21 | Discharge: 2017-03-21 | Disposition: A | Discharge status: Home / Self Care | Payer: Managed Care, Other (non HMO) | Attending: Emergency Medicine | Admitting: Emergency Medicine

## 2017-03-21 ENCOUNTER — Other Ambulatory Visit: Payer: Self-pay

## 2017-03-21 ENCOUNTER — Emergency Department (HOSPITAL_COMMUNITY): Payer: Managed Care, Other (non HMO)

## 2017-03-21 ENCOUNTER — Encounter (HOSPITAL_COMMUNITY): Payer: Self-pay | Admitting: Emergency Medicine

## 2017-03-21 DIAGNOSIS — W010XXA Fall on same level from slipping, tripping and stumbling without subsequent striking against object, initial encounter: Secondary | ICD-10-CM | POA: Insufficient documentation

## 2017-03-21 DIAGNOSIS — Y999 Unspecified external cause status: Secondary | ICD-10-CM | POA: Insufficient documentation

## 2017-03-21 DIAGNOSIS — J45909 Unspecified asthma, uncomplicated: Secondary | ICD-10-CM | POA: Insufficient documentation

## 2017-03-21 DIAGNOSIS — I10 Essential (primary) hypertension: Secondary | ICD-10-CM | POA: Insufficient documentation

## 2017-03-21 DIAGNOSIS — S2232XA Fracture of one rib, left side, initial encounter for closed fracture: Secondary | ICD-10-CM | POA: Insufficient documentation

## 2017-03-21 DIAGNOSIS — F17210 Nicotine dependence, cigarettes, uncomplicated: Secondary | ICD-10-CM | POA: Insufficient documentation

## 2017-03-21 DIAGNOSIS — Z79899 Other long term (current) drug therapy: Secondary | ICD-10-CM | POA: Insufficient documentation

## 2017-03-21 DIAGNOSIS — Y939 Activity, unspecified: Secondary | ICD-10-CM | POA: Diagnosis not present

## 2017-03-21 DIAGNOSIS — Y929 Unspecified place or not applicable: Secondary | ICD-10-CM | POA: Insufficient documentation

## 2017-03-21 DIAGNOSIS — R52 Pain, unspecified: Secondary | ICD-10-CM

## 2017-03-21 DIAGNOSIS — S29001A Unspecified injury of muscle and tendon of front wall of thorax, initial encounter: Secondary | ICD-10-CM | POA: Diagnosis present

## 2017-03-21 HISTORY — DX: Pleurisy: R09.1

## 2017-03-21 LAB — BASIC METABOLIC PANEL
Anion gap: 13 (ref 5–15)
BUN: 10 mg/dL (ref 6–20)
CALCIUM: 9 mg/dL (ref 8.9–10.3)
CO2: 25 mmol/L (ref 22–32)
CREATININE: 0.54 mg/dL (ref 0.44–1.00)
Chloride: 98 mmol/L — ABNORMAL LOW (ref 101–111)
Glucose, Bld: 126 mg/dL — ABNORMAL HIGH (ref 65–99)
Potassium: 3.6 mmol/L (ref 3.5–5.1)
SODIUM: 136 mmol/L (ref 135–145)

## 2017-03-21 LAB — CBC
HCT: 42.1 % (ref 36.0–46.0)
Hemoglobin: 13.5 g/dL (ref 12.0–15.0)
MCH: 29.9 pg (ref 26.0–34.0)
MCHC: 32.1 g/dL (ref 30.0–36.0)
MCV: 93.1 fL (ref 78.0–100.0)
PLATELETS: 372 10*3/uL (ref 150–400)
RBC: 4.52 MIL/uL (ref 3.87–5.11)
RDW: 14 % (ref 11.5–15.5)
WBC: 8.6 10*3/uL (ref 4.0–10.5)

## 2017-03-21 MED ORDER — MORPHINE SULFATE (PF) 2 MG/ML IV SOLN
2.0000 mg | Freq: Once | INTRAVENOUS | Status: AC
  Administered 2017-03-21: 2 mg via INTRAVENOUS
  Filled 2017-03-21: qty 1

## 2017-03-21 MED ORDER — SODIUM CHLORIDE 0.9 % IV BOLUS (SEPSIS)
1000.0000 mL | Freq: Once | INTRAVENOUS | Status: AC
  Administered 2017-03-21: 1000 mL via INTRAVENOUS

## 2017-03-21 MED ORDER — IBUPROFEN 800 MG PO TABS
800.0000 mg | ORAL_TABLET | Freq: Once | ORAL | Status: AC
  Administered 2017-03-21: 800 mg via ORAL
  Filled 2017-03-21: qty 1

## 2017-03-21 NOTE — ED Provider Notes (Signed)
[facility] EMERGENCY DEPARTMENT Provider Note   CSN: [id number] Arrival date & time: 03/21/17  0709     History   Chief Complaint Chief Complaint  Patient presents with  . Fall    rib pain    HPI Ruth Dixon is a 47 y.o. female.  HPI  47 year old female presents today complaining of left sided chest wall pain after fall last night.  She states that she slipped on a hardwood floor and landed on the left lower chest area.  He initially was not too painful.  She awoke with severe pain in the area.  It is worse with movement and deep breathing.  It makes her feel short of breath.  She denies any other injury.  She did not strike her head and is not having neck pain.  She denies being on blood blood thinners.  Not noted any other injuries.  She took 2 hydrocodone with little relief.  Past Medical History:  Diagnosis Date  . Asthma   . Hypertension   . Pleurisy    multiple times  . Reflux     There are no active problems to display for this patient.   Past Surgical History:  Procedure Laterality Date  . HERNIA REPAIR      OB History    No data available       Home Medications    Prior to Admission medications   Medication Sig Start Date End Date Taking? Authorizing Provider  amLODipine (NORVASC) 10 MG tablet Take 10 mg by mouth daily.  04/11/12   [provider]  dicyclomine (BENTYL) 20 MG tablet Take 1 tablet (20 mg total) by mouth every 6 (six) hours as needed for spasms (abdominal cramping). 05/26/15   Samuel JesterMcManus, Kathleen, DO  ERRIN 0.35 MG tablet TAKE AS DIRECTED BY DOCTOR Patient taking differently: TAKE ONE TABLET BY MOUTH ONCE DAILY 04/27/15   Lazaro ArmsEure, Luther H, MD  fish oil-omega-3 fatty acids 1000 MG capsule Take 2 g by mouth daily.    [provider]  FLUoxetine (PROZAC) 40 MG capsule Take 40 mg by mouth daily.    [provider]  HYDROcodone-acetaminophen (NORCO) 10-325 MG per tablet Take 1-2 tablets by mouth every 6 (six) hours as  needed for moderate pain.  04/12/12   [provider]  lansoprazole (PREVACID) 15 MG capsule Take 15 mg by mouth daily.    [provider]  lisinopril (PRINIVIL,ZESTRIL) 10 MG tablet Take 10 mg by mouth daily.  04/11/12   [provider]  ondansetron (ZOFRAN ODT) 4 MG disintegrating tablet Take 1 tablet (4 mg total) by mouth every 8 (eight) hours as needed for nausea or vomiting. 05/26/15   Samuel JesterMcManus, Kathleen, DO    Family History Family History  Problem Relation Age of Onset  . Diabetes Maternal Grandmother   . COPD Maternal Grandmother   . Stroke Maternal Grandmother   . COPD Maternal Grandfather   . COPD Paternal Grandmother   . COPD Paternal Grandfather     Social History Social History   Tobacco Use  . Smoking status: Current Every Day Smoker    Packs/day: 1.00    Types: Cigarettes  . Smokeless tobacco: Never Used  Substance Use Topics  . Alcohol use: Yes    Comment: occ wine for supper  . Drug use: No     Allergies   Patient has no known allergies.   Review of Systems Review of Systems  All other systems reviewed and are negative.  Physical Exam Updated Vital Signs BP 133/90 (BP Location: Right Arm)   Pulse (!) 118   Temp 97.8 F (36.6 C) (Oral)   Resp (!) 28   LMP 02/15/2017   SpO2 93%   Physical Exam  Constitutional: She is oriented to person, place, and time. She appears well-developed and well-nourished. No distress.  Uncomfortable appearing  HENT:  Head: Normocephalic and atraumatic.  Right Ear: External ear normal.  Left Ear: External ear normal.  Nose: Nose normal.  Eyes: Conjunctivae and EOM are normal. Pupils are equal, round, and reactive to light.  Neck: Normal range of motion. Neck supple.  Cardiovascular: Normal rate and regular rhythm.    Pulmonary/Chest: Effort normal and breath sounds normal.  Abdominal: Soft. Bowel sounds are normal.  Musculoskeletal: Normal range of motion.  Neurological: She is alert  and oriented to person, place, and time. She exhibits normal muscle tone. Coordination normal.  Skin: Skin is warm and dry.  Psychiatric: She has a normal mood and affect. Her behavior is normal. Thought content normal.  Nursing note and vitals reviewed.    ED Treatments / Results  Labs (all labs ordered are listed, but only abnormal results are displayed) Labs Reviewed  CBC  BASIC METABOLIC PANEL    EKG  EKG Interpretation None       Radiology No results found.  Procedures Procedures (including critical care time)  Medications Ordered in ED Medications  sodium chloride 0.9 % bolus 1,000 mL (not administered)  morphine 2 MG/ML injection 2 mg (not administered)     Initial Impression / Assessment and Plan / ED Course  I have reviewed the triage vital signs and the nursing notes.  Pertinent labs & imaging results that were available during my care of the patient were reviewed by me and considered in my medical decision making (see chart for details).  Clinical Course as of Mar 21 916  Sat Mar 21, 2017  0916 Reviewed and discussed with patient DG Ribs Unilateral W/Chest Left [DR]    Clinical Course User Index [DR] Margarita Grizzle, MD   Discussed results and current therapy with patient.  Patient on narcotic pain medicine.  Plan to add nsaid and will give incentive spirometer.  Patient requesting work note.  Plan off work until able  To recheck with primary care doctor for recheck and further guidance.   Final Clinical Impressions(s) / ED Diagnoses   Final diagnoses:  Closed fracture of one rib of left side, initial encounter    ED Discharge Orders    None       Margarita Grizzle, MD 03/21/17 [phone]

## 2017-03-21 NOTE — ED Notes (Signed)
Respiratory notified of orders 

## 2017-03-21 NOTE — ED Notes (Signed)
Patient transported to X-ray 

## 2017-03-21 NOTE — Discharge Instructions (Signed)
Please use hot and cold therapy. Use incentive spirometer every 15 minutes while awake. In addition to your usual pain medication you can take ibuprofen 400 mg every 6-8 hours. Recheck with your doctor on Tuesday.

## 2017-03-21 NOTE — ED Triage Notes (Signed)
Pt came home from work and fell on hardwood floor due to slippers. Pt wasn't hurting when went to bed. Pt states woke up in middle of night with several left side rib pain. Worse with movement and breathing.no bruising noted.

## 2017-03-21 NOTE — ED Notes (Addendum)
Patient requesting pain medication. MD notified.  

## 2017-04-21 ENCOUNTER — Other Ambulatory Visit (HOSPITAL_COMMUNITY): Payer: Self-pay | Admitting: Physician Assistant

## 2017-04-21 ENCOUNTER — Ambulatory Visit (HOSPITAL_COMMUNITY)
Admission: RE | Admit: 2017-04-21 | Discharge: 2017-04-21 | Disposition: A | Discharge status: Home / Self Care | Payer: Managed Care, Other (non HMO) | Source: Ambulatory Visit | Attending: Physician Assistant | Admitting: Physician Assistant

## 2017-04-21 DIAGNOSIS — X58XXXD Exposure to other specified factors, subsequent encounter: Secondary | ICD-10-CM | POA: Diagnosis not present

## 2017-04-21 DIAGNOSIS — S2243XD Multiple fractures of ribs, bilateral, subsequent encounter for fracture with routine healing: Secondary | ICD-10-CM | POA: Diagnosis present

## 2017-12-11 ENCOUNTER — Other Ambulatory Visit: Payer: Self-pay

## 2017-12-11 ENCOUNTER — Emergency Department (HOSPITAL_COMMUNITY)
Admission: EM | Admit: 2017-12-11 | Discharge: 2017-12-11 | Disposition: A | Discharge status: Home / Self Care | Payer: Managed Care, Other (non HMO) | Attending: Emergency Medicine | Admitting: Emergency Medicine

## 2017-12-11 ENCOUNTER — Encounter (HOSPITAL_COMMUNITY): Payer: Self-pay | Admitting: Emergency Medicine

## 2017-12-11 DIAGNOSIS — Z79899 Other long term (current) drug therapy: Secondary | ICD-10-CM | POA: Diagnosis not present

## 2017-12-11 DIAGNOSIS — J45909 Unspecified asthma, uncomplicated: Secondary | ICD-10-CM | POA: Insufficient documentation

## 2017-12-11 DIAGNOSIS — R1110 Vomiting, unspecified: Secondary | ICD-10-CM | POA: Diagnosis present

## 2017-12-11 DIAGNOSIS — F17210 Nicotine dependence, cigarettes, uncomplicated: Secondary | ICD-10-CM | POA: Insufficient documentation

## 2017-12-11 DIAGNOSIS — I10 Essential (primary) hypertension: Secondary | ICD-10-CM | POA: Diagnosis not present

## 2017-12-11 DIAGNOSIS — R252 Cramp and spasm: Secondary | ICD-10-CM | POA: Diagnosis not present

## 2017-12-11 DIAGNOSIS — R1114 Bilious vomiting: Secondary | ICD-10-CM | POA: Diagnosis not present

## 2017-12-11 HISTORY — DX: Irritable bowel syndrome, unspecified: K58.9

## 2017-12-11 LAB — COMPREHENSIVE METABOLIC PANEL
ALT: 31 U/L (ref 0–44)
ANION GAP: 11 (ref 5–15)
AST: 58 U/L — ABNORMAL HIGH (ref 15–41)
Albumin: 3.9 g/dL (ref 3.5–5.0)
Alkaline Phosphatase: 51 U/L (ref 38–126)
BILIRUBIN TOTAL: 0.7 mg/dL (ref 0.3–1.2)
BUN: 10 mg/dL (ref 6–20)
CHLORIDE: 99 mmol/L (ref 98–111)
CO2: 25 mmol/L (ref 22–32)
Calcium: 8.7 mg/dL — ABNORMAL LOW (ref 8.9–10.3)
Creatinine, Ser: 0.47 mg/dL (ref 0.44–1.00)
GFR calc Af Amer: >60 mL/min (ref >60)
GFR calc non Af Amer: >60 mL/min (ref >60)
Glucose, Bld: 124 mg/dL — ABNORMAL HIGH (ref 70–99)
POTASSIUM: 3.7 mmol/L (ref 3.5–5.1)
SODIUM: 135 mmol/L (ref 135–145)
TOTAL PROTEIN: 7.2 g/dL (ref 6.5–8.1)

## 2017-12-11 LAB — CBC
HCT: 41.9 % (ref 36.0–46.0)
HEMOGLOBIN: 13.6 g/dL (ref 12.0–15.0)
MCH: 30.2 pg (ref 26.0–34.0)
MCHC: 32.5 g/dL (ref 30.0–36.0)
MCV: 92.9 fL (ref 80.0–100.0)
Platelets: 407 10*3/uL — ABNORMAL HIGH (ref 150–400)
RBC: 4.51 MIL/uL (ref 3.87–5.11)
RDW: 14.6 % (ref 11.5–15.5)
WBC: 8.1 10*3/uL (ref 4.0–10.5)
nRBC: 0 % (ref 0.0–0.2)

## 2017-12-11 LAB — LIPASE, BLOOD: LIPASE: 156 U/L — AB (ref 11–51)

## 2017-12-11 MED ORDER — PROMETHAZINE HCL 25 MG/ML IJ SOLN
INTRAMUSCULAR | Status: AC
  Filled 2017-12-11: qty 1

## 2017-12-11 MED ORDER — PROMETHAZINE HCL 25 MG/ML IJ SOLN
25.0000 mg | Freq: Once | INTRAMUSCULAR | Status: AC
  Administered 2017-12-11: 25 mg via INTRAMUSCULAR

## 2017-12-11 NOTE — ED Notes (Signed)
Pt continues to bicker with sister  Out to desk insisting that she is ready to leave  Encouraged to stay and let physician speak with her   Call to Dr Jeraldine Loots who is coming in to discuss her labs

## 2017-12-11 NOTE — ED Triage Notes (Signed)
Patient complaining of intermittent vomiting and abdominal pain x 2 months. Patient is upset due to sister making her come to ER, states "I have IBS and I think it's just my IBS."

## 2017-12-11 NOTE — ED Notes (Signed)
Pt very agitated and declines repeat VS as she wants to go  DrL has discussed with pt and she leaves with her sister driving

## 2017-12-11 NOTE — ED Notes (Signed)
Pt has had no emesis since arrival   Spoke to [place], RN, CN saying that she was going outside for a minute

## 2017-12-11 NOTE — ED Notes (Signed)
Pt sister reports pt has had N/V up to 5 times daily for the last month Per pt , was evaluated at [facility] 2 weeks ago and it was suggested that she await the new year to have referral and testing due to "everyone was full and the holidays"  Sisters are bickering loudly and the arguments are inturrupted by this RN who request that they bicker quietly or one of the sisters will be removed from the room   Sister with pt reports that she wants her evaluated "because she is sick"

## 2017-12-11 NOTE — ED Notes (Signed)
Dr L in to speak with pt

## 2017-12-11 NOTE — Discharge Instructions (Addendum)
He is to be sure to use your Phenergan, drink only in moderation, and for the next 3 days use a clear liquid diet only. Return here for concerning changes in your condition.

## 2017-12-11 NOTE — ED Provider Notes (Signed)
[facility] EMERGENCY DEPARTMENT Provider Note   CSN: [id number] Arrival date & time: 12/11/17  1526     History   Chief Complaint Chief Complaint  Patient presents with  . Emesis    HPI Ruth Dixon is a 47 y.o. female.  HPI Patient presents with her sister who assists with the HPI. The patient has a history of IBS, has not seen a physician in decades. She notes that over the past 2 months she has had multiple episodes of emesis daily. No clear precipitant, and she notes that she sometimes has emesis upon awakening, other times after eating, other times triggered by smells. No substantial pain, though she does have occasional crampy sensation throughout her abdomen, has had irregular bowel movements for a very long time. No recent fever, chills, chest pain, dyspnea.  The patient does not smoke cigarettes. She takes medication for mood stabilization regularly. Patient presents today because it was only now that she informed her sister of her ongoing emesis.  Past Medical History:  Diagnosis Date  . Asthma   . Hypertension   . IBS (irritable bowel syndrome)   . Pleurisy    multiple times  . Reflux     There are no active problems to display for this patient.   Past Surgical History:  Procedure Laterality Date  . HERNIA REPAIR       OB History   None      Home Medications    Prior to Admission medications   Medication Sig Start Date End Date Taking? Authorizing Provider  amLODipine (NORVASC) 10 MG tablet Take 10 mg by mouth daily.  04/11/12   [provider]  dicyclomine (BENTYL) 20 MG tablet Take 1 tablet (20 mg total) by mouth every 6 (six) hours as needed for spasms (abdominal cramping). 05/26/15   Samuel Jester, DO  ERRIN 0.35 MG tablet TAKE AS DIRECTED BY DOCTOR Patient taking differently: TAKE ONE TABLET BY MOUTH ONCE DAILY 04/27/15   Lazaro Arms, MD  fish oil-omega-3 fatty acids 1000 MG capsule Take 2 g by mouth daily.    [provider]  FLUoxetine (PROZAC) 40 MG capsule Take 40 mg by mouth daily.    [provider]  HYDROcodone-acetaminophen (NORCO) 10-325 MG per tablet Take 1-2 tablets by mouth every 6 (six) hours as needed for moderate pain.  04/12/12   [provider]  lansoprazole (PREVACID) 15 MG capsule Take 15 mg by mouth daily.    [provider]  lisinopril (PRINIVIL,ZESTRIL) 10 MG tablet Take 10 mg by mouth daily.  04/11/12   [provider]  ondansetron (ZOFRAN ODT) 4 MG disintegrating tablet Take 1 tablet (4 mg total) by mouth every 8 (eight) hours as needed for nausea or vomiting. 05/26/15   Samuel Jester, DO    Family History Family History  Problem Relation Age of Onset  . Diabetes Maternal Grandmother   . COPD Maternal Grandmother   . Stroke Maternal Grandmother   . COPD Maternal Grandfather   . COPD Paternal Grandmother   . COPD Paternal Grandfather     Social History Social History   Tobacco Use  . Smoking status: Current Every Day Smoker    Packs/day: 1.00    Types: Cigarettes  . Smokeless tobacco: Never Used  Substance Use Topics  . Alcohol use: Yes    Comment: 1 pint of liquor  . Drug use: No     Allergies   Patient has no known allergies.  Review of Systems Review of Systems  Constitutional:       Per HPI, otherwise negative  HENT:       Per HPI, otherwise negative  Respiratory:       Per HPI, otherwise negative  Cardiovascular:       Per HPI, otherwise negative  Gastrointestinal: Positive for diarrhea, nausea and vomiting.  Endocrine:       Negative aside from HPI  Genitourinary:       Neg aside from HPI   Musculoskeletal:       Per HPI, otherwise negative  Skin: Negative.   Neurological: Negative for syncope.     Physical Exam Updated Vital Signs BP 110/72 (BP Location: Right Arm)   Pulse (!) 107   Temp 97.9 F (36.6 C) (Oral)   Resp 18   Ht 5\' 8"  (1.727 m)   Wt 81.2 kg   SpO2 98%   BMI 27.22 kg/m    Physical Exam  Constitutional: She is oriented to person, place, and time. She appears well-developed and well-nourished. No distress.  HENT:  Head: Normocephalic and atraumatic.  Eyes: Conjunctivae and EOM are normal.  Cardiovascular: Normal rate and regular rhythm.  Pulmonary/Chest: Effort normal and breath sounds normal. No stridor. No respiratory distress.  Abdominal: She exhibits no distension and no mass. There is no tenderness. There is no rebound and no guarding.  Musculoskeletal: She exhibits no edema.  Neurological: She is alert and oriented to person, place, and time. No cranial nerve deficit.  Skin: Skin is warm and dry.  Psychiatric: She has a normal mood and affect.  Nursing note and vitals reviewed.    ED Treatments / Results  Labs (all labs ordered are listed, but only abnormal results are displayed) Labs Reviewed  LIPASE, BLOOD - Abnormal; Notable for the following components:      Result Value   Lipase 156 (*)    All other components within normal limits  COMPREHENSIVE METABOLIC PANEL - Abnormal; Notable for the following components:   Glucose, Bld 124 (*)    Calcium 8.7 (*)    AST 58 (*)    All other components within normal limits  CBC - Abnormal; Notable for the following components:   Platelets 407 (*)    All other components within normal limits  URINALYSIS, ROUTINE W REFLEX MICROSCOPIC  I-STAT BETA HCG BLOOD, ED ([facility], [facility], [facility] ONLY)    Procedures Procedures (including critical care time)  Medications Ordered in ED Medications  promethazine (PHENERGAN) injection 25 mg (has no administration in time range)     Initial Impression / Assessment and Plan / ED Course  I have reviewed the triage vital signs and the nursing notes.  Pertinent labs & imaging results that were available during my care of the patient were reviewed by me and considered in my medical decision making (see chart for details).     5:10 PM Initial labs notable for elevated  lipase level. On repeat exam patient is retching. Patient is not amenable to further evaluation, nor IV fluids Patient has capacity to make this request for discharge. Prior to discharge, however I was able to convince the patient to receive IM Phenergan, and we did discuss pancreatitis, need to minimal alcohol intake, for ongoing clear liquid diet, need for GI follow-up. Patient not amenable to additional evaluation, does not seem to acknowledge discharge instructions.  Final Clinical Impressions(s) / ED Diagnoses   Final diagnoses:  Bilious vomiting with nausea  Acute pancreatitis  Gerhard Munch, MD 12/11/17 [phone]

## 2017-12-30 ENCOUNTER — Telehealth: Payer: Self-pay | Admitting: Gastroenterology

## 2017-12-30 NOTE — Telephone Encounter (Signed)
Yes

## 2017-12-30 NOTE — Telephone Encounter (Signed)
Pt seen in the ER and was recommended to follow up with us. Can we accept her as a new patient?  °

## 2018-01-04 NOTE — Telephone Encounter (Signed)
LMOM of appt date and time

## 2018-01-06 ENCOUNTER — Encounter: Payer: Self-pay | Admitting: *Deleted

## 2018-01-06 ENCOUNTER — Other Ambulatory Visit: Payer: Self-pay | Admitting: *Deleted

## 2018-01-06 ENCOUNTER — Telehealth: Payer: Self-pay | Admitting: *Deleted

## 2018-01-06 ENCOUNTER — Other Ambulatory Visit: Payer: Self-pay

## 2018-01-06 ENCOUNTER — Ambulatory Visit (INDEPENDENT_AMBULATORY_CARE_PROVIDER_SITE_OTHER): Payer: Managed Care, Other (non HMO) | Admitting: Nurse Practitioner

## 2018-01-06 ENCOUNTER — Encounter: Payer: Self-pay | Admitting: Nurse Practitioner

## 2018-01-06 DIAGNOSIS — R112 Nausea with vomiting, unspecified: Secondary | ICD-10-CM | POA: Insufficient documentation

## 2018-01-06 DIAGNOSIS — R1319 Other dysphagia: Secondary | ICD-10-CM

## 2018-01-06 DIAGNOSIS — K219 Gastro-esophageal reflux disease without esophagitis: Secondary | ICD-10-CM | POA: Diagnosis not present

## 2018-01-06 DIAGNOSIS — R1310 Dysphagia, unspecified: Secondary | ICD-10-CM

## 2018-01-06 DIAGNOSIS — R1010 Upper abdominal pain, unspecified: Secondary | ICD-10-CM

## 2018-01-06 DIAGNOSIS — K625 Hemorrhage of anus and rectum: Secondary | ICD-10-CM

## 2018-01-06 DIAGNOSIS — R109 Unspecified abdominal pain: Secondary | ICD-10-CM | POA: Insufficient documentation

## 2018-01-06 MED ORDER — PEG 3350-KCL-NA BICARB-NACL 420 G PO SOLR: 4000.0000 mL | Freq: Once | ORAL | 0 refills | Status: AC

## 2018-01-06 MED ORDER — ONDANSETRON HCL 4 MG PO TABS: 4.0000 mg | ORAL_TABLET | Freq: Three times a day (TID) | ORAL | 2 refills | Status: DC | PRN

## 2018-01-06 NOTE — Progress Notes (Signed)
Primary Care Physician:  Assunta FoundGolding, John, MD Primary Gastroenterologist:  Dr. Jena Gaussourk  Chief Complaint  Patient presents with  . Emesis    went to ER    HPI:   Ruth Dixon is a 47 y.o. female who presents on referral from the emergency department for vomiting.  The patient was seen in the ED 12/11/2017 for bilious vomiting with nausea.  At that time noted history of IBS, had not seen a physician in decades, multiple episodes of emesis in the past 2 months with no identified trigger.  Sometimes after eating, sometimes triggered by smells, sometimes just starts upon awakening.  Occasional crampy abdominal pain, irregular bowel movements for very long time.  The patient did have elevated lipase at 157.  The patient was requesting to leave, although prior to discharge she agreed to receive IM Phenergan.  Discussed possible pancreatitis, minimal alcohol intake recommendation, ongoing clear liquid diet and GI follow-up.  Today she states she's doing ok overall. She states the Phenergan helped before leaving the ER. She told them she has anti-nausea at home, but she doesn't. Initially thought she had a GI virus. Vomiting is persistent. Cannot identify any triggers; always in the morning, dry heaving progressive to vomiting. Sometimes later in the day post-prandially. Also occurs after drinking alcohol. Takes Ibuprofen for back pain about once a day. Has epigastric abdominal pain, bloating even with minimal intake. Previously diagnosed with IBS. Is having associated diarrhea, which is chronic for her. Having about 7 stools a day. Also with hematochezia twice a month, typically with wiping and states she has hemorrhoids. Had a colonoscopy at [facility] about 20-25 years ago. Denies melena. States she has fevers, 98-99 "which I know isn't a fever, but I'm hot" and also has "hot flashes, but I'm going through the change." Denies unintentional weight loss. Denies chest pain, dyspnea, dizziness,  lightheadedness, syncope, near syncope. Denies any other upper or lower GI symptoms.  Was on Bentyl but it made her sick so she stopped it.  Currently on Lansoprazole, has been on it for years. Previously tried Nexium.  At the end of her visit she also mentioned solid food dysphagia as a symptoms she's having.  Past Medical History:  Diagnosis Date  . Asthma   . Hypertension   . IBS (irritable bowel syndrome)   . Pleurisy    multiple times  . Reflux     Past Surgical History:  Procedure Laterality Date  . HERNIA REPAIR      Current Outpatient Medications  Medication Sig Dispense Refill  . amLODipine (NORVASC) 10 MG tablet Take 10 mg by mouth daily.     Marland Kitchen. aspirin EC 81 MG tablet Take 81 mg by mouth daily.    Marland Kitchen. ERRIN 0.35 MG tablet TAKE AS DIRECTED BY DOCTOR (Patient taking differently: TAKE ONE TABLET BY MOUTH ONCE DAILY) 28 tablet 12  . fish oil-omega-3 fatty acids 1000 MG capsule Take 2 g by mouth daily.    Marland Kitchen. FLUoxetine (PROZAC) 40 MG capsule Take 40 mg by mouth daily.    Marland Kitchen. HYDROcodone-acetaminophen (NORCO) 10-325 MG per tablet Take 1-2 tablets by mouth every 6 (six) hours as needed for moderate pain.     Marland Kitchen. ibuprofen (ADVIL,MOTRIN) 200 MG tablet Take 400-600 mg by mouth every 6 (six) hours as needed.    . lansoprazole (PREVACID) 15 MG capsule Take 15 mg by mouth daily.    Marland Kitchen. lisinopril (PRINIVIL,ZESTRIL) 10 MG tablet Take 10 mg by mouth daily.     .Marland Kitchen  ondansetron (ZOFRAN ODT) 4 MG disintegrating tablet Take 1 tablet (4 mg total) by mouth every 8 (eight) hours as needed for nausea or vomiting. 6 tablet 0  . venlafaxine (EFFEXOR) 37.5 MG tablet Take 37.5 mg by mouth 2 (two) times daily.     No current facility-administered medications for this visit.     Allergies as of 01/06/2018  . (No Known Allergies)    Family History  Problem Relation Age of Onset  . Diabetes Maternal Grandmother   . COPD Maternal Grandmother   . Stroke Maternal Grandmother   . COPD Maternal  Grandfather   . COPD Paternal Grandmother   . COPD Paternal Grandfather   . Colon cancer Neg Hx     Social History   Socioeconomic History  . Marital status: Divorced    Spouse name: Not on file  . Number of children: Not on file  . Years of education: Not on file  . Highest education level: Not on file  Occupational History  . Not on file  Social Needs  . Financial resource strain: Not on file  . Food insecurity:    Worry: Not on file    Inability: Not on file  . Transportation needs:    Medical: Not on file    Non-medical: Not on file  Tobacco Use  . Smoking status: Current Every Day Smoker    Packs/day: 1.00    Types: Cigarettes  . Smokeless tobacco: Never Used  Substance and Sexual Activity  . Alcohol use: Yes    Comment: 0.25-0.5 pints a day; previously 1 pint of liquor a day  . Drug use: No  . Sexual activity: Yes    Birth control/protection: Pill  Lifestyle  . Physical activity:    Days per week: Not on file    Minutes per session: Not on file  . Stress: Not on file  Relationships  . Social connections:    Talks on phone: Not on file    Gets together: Not on file    Attends religious service: Not on file    Active member of club or organization: Not on file    Attends meetings of clubs or organizations: Not on file    Relationship status: Not on file  . Intimate partner violence:    Fear of current or ex partner: Not on file    Emotionally abused: Not on file    Physically abused: Not on file    Forced sexual activity: Not on file  Other Topics Concern  . Not on file  Social History Narrative  . Not on file    Review of Systems: General: Negative for anorexia, weight loss, fever, chills, fatigue, weakness. ENT: Negative for hoarseness. Admits intermittent solid food dysphagia. CV: Negative for chest pain, angina, palpitations, peripheral edema.  Respiratory: Negative for dyspnea at rest, cough, sputum, wheezing.  GI: See history of present  illness. MS: Admits chronic back pain.  Derm: Negative for rash or itching.  Endo: Negative for unusual weight change.  Heme: Negative for bruising or bleeding. Allergy: Negative for rash or hives.   Physical Exam: BP (!) 146/99   Pulse (!) 111   Temp 97.9 F (36.6 C) (Oral)   Ht 5\' 8"  (1.727 m)   Wt 182 lb 9.6 oz (82.8 kg)   BMI 27.76 kg/m  General:   Alert and oriented. Pleasant and cooperative. Well-nourished and well-developed.  Head:  Normocephalic and atraumatic. Eyes:  Without icterus, sclera clear and conjunctiva pink.  Ears:  Normal auditory acuity. Cardiovascular:  S1, S2 present without murmurs appreciated. Extremities without clubbing or edema. Respiratory:  Bilateral rhonchi and wheezes. No distress.  Gastrointestinal:  +BS, soft, non-tender and non-distended. No HSM noted. No guarding or rebound. No masses appreciated.  Rectal:  Deferred  Musculoskalatal:  Symmetrical without gross deformities. Neurologic:  Alert and oriented x4;  grossly normal neurologically. Psych:  Alert and cooperative. Normal mood and affect. Heme/Lymph/Immune: No excessive bruising noted.    01/06/2018 3:31 PM   Disclaimer: This note was dictated with voice recognition software. Similar sounding words can inadvertently be transcribed and may not be corrected upon review.

## 2018-01-06 NOTE — Telephone Encounter (Signed)
Patient called back and advised me to call her fiance  Nehemiah SettleJeremy moyer to give u/s appt details. Spoke with jeremy and made aware of u/s appt details. He voiced understanding.

## 2018-01-06 NOTE — Patient Instructions (Signed)
1. Have your labs and stool studies completed when you are able to. 2. We will help schedule your ultrasound for you. 3. We will schedule your colonoscopy and upper endoscopy for you. 4. I am giving you samples of Dexilant.  Call us in 1 week and let us know if it is helping your symptoms. 5. It is important that she decrease, and eventually stop, drinking alcohol. 6. Stop taking NSAIDs (ibuprofen, Motrin, Advil, Aleve, naproxen, Naprosyn, aspirin, aspirin powders).  You can use Tylenol over-the-counter which would not irritate your stomach. 7. Return for follow-up in 3 months. 8. Call us if you have any questions or concerns.  At [facility] we value your feedback. You may receive a survey about your visit today. Please share your experience as we strive to create trusting relationships with our patients to provide genuine, compassionate, quality care.  We appreciate your understanding and patience as we review any laboratory studies, imaging, and other diagnostic tests that are ordered as we care for you. Our office policy is 5 business days for review of these results, and any emergent or urgent results are addressed in a timely manner for your best interest. If you do not hear from our office in 1 week, please contact us.   We also encourage the use of MyChart, which contains your medical information for your review as well. If you are not enrolled in this feature, an access code is on this after visit summary for your convenience. Thank you for allowing us to be involved in your care.  It was great to see you today!  I hope you have a Merry Christmas!!

## 2018-01-06 NOTE — H&P (View-Only) (Signed)
  Primary Care Physician:  Golding, John, MD Primary Gastroenterologist:  Dr. Rourk  Chief Complaint  Patient presents with  . Emesis    went to ER    HPI:   Ruth Dixon is a 47 y.o. female who presents on referral from the emergency department for vomiting.  The patient was seen in the ED 12/11/2017 for bilious vomiting with nausea.  At that time noted history of IBS, had not seen a physician in decades, multiple episodes of emesis in the past 2 months with no identified trigger.  Sometimes after eating, sometimes triggered by smells, sometimes just starts upon awakening.  Occasional crampy abdominal pain, irregular bowel movements for very long time.  The patient did have elevated lipase at 157.  The patient was requesting to leave, although prior to discharge she agreed to receive IM Phenergan.  Discussed possible pancreatitis, minimal alcohol intake recommendation, ongoing clear liquid diet and GI follow-up.  Today she states she's doing ok overall. She states the Phenergan helped before leaving the ER. She told them she has anti-nausea at home, but she doesn't. Initially thought she had a GI virus. Vomiting is persistent. Cannot identify any triggers; always in the morning, dry heaving progressive to vomiting. Sometimes later in the day post-prandially. Also occurs after drinking alcohol. Takes Ibuprofen for back pain about once a day. Has epigastric abdominal pain, bloating even with minimal intake. Previously diagnosed with IBS. Is having associated diarrhea, which is chronic for her. Having about 7 stools a day. Also with hematochezia twice a month, typically with wiping and states she has hemorrhoids. Had a colonoscopy at [place] about 20-25 years ago. Denies melena. States she has fevers, 98-99 "which I know isn't a fever, but I'm hot" and also has "hot flashes, but I'm going through the change." Denies unintentional weight loss. Denies chest pain, dyspnea, dizziness,  lightheadedness, syncope, near syncope. Denies any other upper or lower GI symptoms.  Was on Bentyl but it made her sick so she stopped it.  Currently on Lansoprazole, has been on it for years. Previously tried Nexium.  At the end of her visit she also mentioned solid food dysphagia as a symptoms she's having.  Past Medical History:  Diagnosis Date  . Asthma   . Hypertension   . IBS (irritable bowel syndrome)   . Pleurisy    multiple times  . Reflux     Past Surgical History:  Procedure Laterality Date  . HERNIA REPAIR      Current Outpatient Medications  Medication Sig Dispense Refill  . amLODipine (NORVASC) 10 MG tablet Take 10 mg by mouth daily.     . aspirin EC 81 MG tablet Take 81 mg by mouth daily.    . ERRIN 0.35 MG tablet TAKE AS DIRECTED BY DOCTOR (Patient taking differently: TAKE ONE TABLET BY MOUTH ONCE DAILY) 28 tablet 12  . fish oil-omega-3 fatty acids 1000 MG capsule Take 2 g by mouth daily.    . FLUoxetine (PROZAC) 40 MG capsule Take 40 mg by mouth daily.    . HYDROcodone-acetaminophen (NORCO) 10-325 MG per tablet Take 1-2 tablets by mouth every 6 (six) hours as needed for moderate pain.     . ibuprofen (ADVIL,MOTRIN) 200 MG tablet Take 400-600 mg by mouth every 6 (six) hours as needed.    . lansoprazole (PREVACID) 15 MG capsule Take 15 mg by mouth daily.    . lisinopril (PRINIVIL,ZESTRIL) 10 MG tablet Take 10 mg by mouth daily.     .   ondansetron (ZOFRAN ODT) 4 MG disintegrating tablet Take 1 tablet (4 mg total) by mouth every 8 (eight) hours as needed for nausea or vomiting. 6 tablet 0  . venlafaxine (EFFEXOR) 37.5 MG tablet Take 37.5 mg by mouth 2 (two) times daily.     No current facility-administered medications for this visit.     Allergies as of 01/06/2018  . (No Known Allergies)    Family History  Problem Relation Age of Onset  . Diabetes Maternal Grandmother   . COPD Maternal Grandmother   . Stroke Maternal Grandmother   . COPD Maternal  Grandfather   . COPD Paternal Grandmother   . COPD Paternal Grandfather   . Colon cancer Neg Hx     Social History   Socioeconomic History  . Marital status: Divorced    Spouse name: Not on file  . Number of children: Not on file  . Years of education: Not on file  . Highest education level: Not on file  Occupational History  . Not on file  Social Needs  . Financial resource strain: Not on file  . Food insecurity:    Worry: Not on file    Inability: Not on file  . Transportation needs:    Medical: Not on file    Non-medical: Not on file  Tobacco Use  . Smoking status: Current Every Day Smoker    Packs/day: 1.00    Types: Cigarettes  . Smokeless tobacco: Never Used  Substance and Sexual Activity  . Alcohol use: Yes    Comment: 0.25-0.5 pints a day; previously 1 pint of liquor a day  . Drug use: No  . Sexual activity: Yes    Birth control/protection: Pill  Lifestyle  . Physical activity:    Days per week: Not on file    Minutes per session: Not on file  . Stress: Not on file  Relationships  . Social connections:    Talks on phone: Not on file    Gets together: Not on file    Attends religious service: Not on file    Active member of club or organization: Not on file    Attends meetings of clubs or organizations: Not on file    Relationship status: Not on file  . Intimate partner violence:    Fear of current or ex partner: Not on file    Emotionally abused: Not on file    Physically abused: Not on file    Forced sexual activity: Not on file  Other Topics Concern  . Not on file  Social History Narrative  . Not on file    Review of Systems: General: Negative for anorexia, weight loss, fever, chills, fatigue, weakness. ENT: Negative for hoarseness. Admits intermittent solid food dysphagia. CV: Negative for chest pain, angina, palpitations, peripheral edema.  Respiratory: Negative for dyspnea at rest, cough, sputum, wheezing.  GI: See history of present  illness. MS: Admits chronic back pain.  Derm: Negative for rash or itching.  Endo: Negative for unusual weight change.  Heme: Negative for bruising or bleeding. Allergy: Negative for rash or hives.   Physical Exam: BP (!) 146/99   Pulse (!) 111   Temp 97.9 F (36.6 C) (Oral)   Ht 5' 8" (1.727 m)   Wt 182 lb 9.6 oz (82.8 kg)   BMI 27.76 kg/m  General:   Alert and oriented. Pleasant and cooperative. Well-nourished and well-developed.  Head:  Normocephalic and atraumatic. Eyes:  Without icterus, sclera clear and conjunctiva pink.    Ears:  Normal auditory acuity. Cardiovascular:  S1, S2 present without murmurs appreciated. Extremities without clubbing or edema. Respiratory:  Bilateral rhonchi and wheezes. No distress.  Gastrointestinal:  +BS, soft, non-tender and non-distended. No HSM noted. No guarding or rebound. No masses appreciated.  Rectal:  Deferred  Musculoskalatal:  Symmetrical without gross deformities. Neurologic:  Alert and oriented x4;  grossly normal neurologically. Psych:  Alert and cooperative. Normal mood and affect. Heme/Lymph/Immune: No excessive bruising noted.    01/06/2018 3:31 PM   Disclaimer: This note was dictated with voice recognition software. Similar sounding words can inadvertently be transcribed and may not be corrected upon review.  

## 2018-01-06 NOTE — Telephone Encounter (Signed)
Patient left in a hurry stating she had another appt to be at. Patient did not let me go over TCS instructions with her in detail. Stated she will look over these later.  Patient also needed an u/s done. Left prior to this being scheduled. I called central scheduling and u/s scheduled for 01/13/18 at 8:30am, arrival time 8:15am, npo after midnight prior to test.  Called pt and LMTCB.

## 2018-01-06 NOTE — Progress Notes (Signed)
Zofran sent in after pts OV on 01/06/18, per EG.

## 2018-01-07 ENCOUNTER — Encounter: Payer: Self-pay | Admitting: Internal Medicine

## 2018-01-07 ENCOUNTER — Telehealth: Payer: Self-pay | Admitting: Internal Medicine

## 2018-01-07 DIAGNOSIS — K625 Hemorrhage of anus and rectum: Secondary | ICD-10-CM | POA: Insufficient documentation

## 2018-01-07 NOTE — Telephone Encounter (Signed)
Pt was seen recently and has questions about the prep. Please call Dorene SorrowJerry at [phone]

## 2018-01-07 NOTE — Telephone Encounter (Signed)
Pre-op scheduled for 01/14/18 at 10:00am. Called patient and she advised me to speak with fiance. Made fiance aware of appt details.

## 2018-01-07 NOTE — Telephone Encounter (Signed)
Called Dorene SorrowJerry. He states patient has prep but he needs to know what to do with it. I explained to him patient was given 3 pages of instructions yesterday. She should have these that explains step by step what she needs to do. He will look and see what she has. If she does not then he will let us know.

## 2018-01-08 NOTE — Assessment & Plan Note (Signed)
The patient has GERD symptoms and at the end of her visit also mention in passing dysphagia symptoms.  Solid food dysphagia.  In the setting of chronic GERD symptoms including epigastric pain, nausea, vomiting.  Further GERD management as per above.  We will plan for an upper endoscopy with possible dilation.  Query stricture, web, ring due to persistent esophagitis related to GERD.  Further recommendations as per above.  Follow-up in 3 months.  Proceed with EGD +/- dilation on propofol/MAC with Dr. Gala Romney in near future: the risks, benefits, and alternatives have been discussed with the patient in detail. The patient states understanding and desires to proceed.  The patient is currently on Prozac, Norco, Effexor.  No other anticoagulants, anxiolytics, chronic pain medications, or antidepressants.  We will plan for the procedure on propofol/MAC to promote adequate sedation.

## 2018-01-08 NOTE — Assessment & Plan Note (Signed)
The patient having GERD-like symptoms with nausea, vomiting, epigastric abdominal pain in the setting of frequent alcohol intake and ibuprofen daily.  Has been on lansoprazole for years, previously tried Nexium.  This point we will check labs as per below.  I will check an abdominal ultrasound as well.  Change PPI to Dexilant, decrease in eventually eliminate alcohol intake.  Stop NSAIDs, substitute Tylenol instead.  Follow-up in 3 months.  Upper endoscopy should help assess other potential etiologies as well.

## 2018-01-08 NOTE — Assessment & Plan Note (Signed)
Epigastric abdominal pain in addition to GERD-like symptoms including esophageal burning, nausea, vomiting.  Further GERD management as per above.  I feel her pain is likely predominantly caused by GERD type symptoms, including possible esophagitis, gastritis, duodenitis, peptic ulcer disease, duodenal ulcer.  We will plan EGD as per above.  Change PPI to Dexilant, decrease and eliminate alcohol intake, stop NSAIDs.  Follow-up in 3 months.

## 2018-01-08 NOTE — Assessment & Plan Note (Signed)
The patient has persistent nausea and vomiting associated with upper GI discomfort, IBS diarrhea type symptoms, chronic diarrhea.  Her symptoms seem to be worse after drinking alcohol.  She also takes ibuprofen at least once a day.  Her pain is epigastric.  Likely GERD at play, although most likely multifactorial in nature.  I feel she should have improvement in her nausea and vomiting with improvement in GERD symptoms.  I am changing her PPI to Dexilant, recommend decreased alcohol intake and eventual alcohol cessation.  Stop NSAIDs.  Upper endoscopy will further shed light on her symptoms, as per below.  Follow-up in 3 months.

## 2018-01-08 NOTE — Assessment & Plan Note (Signed)
The patient complains of rectal bleeding as scant toilet tissue hematochezia.  No colonoscopy in about 20 to 25 years.  Known hemorrhoids.  IBS diarrhea type symptoms with 7 stools a day.  At this point I will check a CBC, CMP, lipase, stool studies.  We will check an abdominal ultrasound as well.  We will proceed with colonoscopy to further evaluate.  Follow-up in 3 months.  Proceed with TCS with Dr. Gala Romney in near future: the risks, benefits, and alternatives have been discussed with the patient in detail. The patient states understanding and desires to proceed.  The patient is currently on Prozac, Norco, Effexor.  No other anticoagulants, anxiolytics, chronic pain medications, or antidepressants.  We will plan for the procedure on propofol/MAC to promote adequate sedation.

## 2018-01-11 NOTE — Progress Notes (Signed)
CC'D TO PCP °

## 2018-01-12 NOTE — Patient Instructions (Signed)
Ruth Dixon  01/12/2018     @PREFPERIOPPHARMACY @   Your procedure is scheduled on 01/21/2018.  Report to [facility] at  1115  A.M.  Call this number if you have problems the morning of surgery:  [phone]   Remember:  Follow the diet and prep instructions given to you by Dr Luvenia Starch office.                   Take these medicines the morning of surgery with A SIP OF WATER  Amlodipine, prozac, hydrocodone ( if needed), prevacid, lisinopril, zofran ( if needed), effexor.    Do not wear jewelry, make-up or nail polish.  Do not wear lotions, powders, or perfumes, or deodorant.  Do not shave 48 hours prior to surgery.  Men may shave face and neck.  Do not bring valuables to the hospital.  [facility] is not responsible for any belongings or valuables.  Contacts, dentures or bridgework may not be worn into surgery.  Leave your suitcase in the car.  After surgery it may be brought to your room.  For patients admitted to the hospital, discharge time will be determined by your treatment team.  Patients discharged the day of surgery will not be allowed to drive home.   Name and phone number of your driver:   family Special instructions:  None  Please read over the following fact sheets that you were given. Anesthesia Post-op Instructions and Care and Recovery After Surgery       Esophagogastroduodenoscopy Esophagogastroduodenoscopy (EGD) is a procedure to examine the lining of the esophagus, stomach, and first part of the small intestine (duodenum). This procedure is done to check for problems such as inflammation, bleeding, ulcers, or growths. During this procedure, a long, flexible, lighted tube with a camera attached (endoscope) is inserted down the throat. Tell a health care provider about:  Any allergies you have.  All medicines you are taking, including vitamins, herbs, eye drops, creams, and over-the-counter medicines.  Any problems  you or family members have had with anesthetic medicines.  Any blood disorders you have.  Any surgeries you have had.  Any medical conditions you have.  Whether you are pregnant or may be pregnant. What are the risks? Generally, this is a safe procedure. However, problems may occur, including:  Infection.  Bleeding.  A tear (perforation) in the esophagus, stomach, or duodenum.  Trouble breathing.  Excessive sweating.  Spasms of the larynx.  A slowed heartbeat.  Low blood pressure.  What happens before the procedure?  Follow instructions from your health care provider about eating or drinking restrictions.  Ask your health care provider about: ? Changing or stopping your regular medicines. This is especially important if you are taking diabetes medicines or blood thinners. ? Taking medicines such as aspirin and ibuprofen. These medicines can thin your blood. Do not take these medicines before your procedure if your health care provider instructs you not to.  Plan to have someone take you home after the procedure.  If you wear dentures, be ready to remove them before the procedure. What happens during the procedure?  To reduce your risk of infection, your health care team will wash or sanitize their hands.  An IV tube will be put in a vein in your hand or arm. You will get medicines and fluids through this tube.  You will be given one or more  of the following: ? A medicine to help you relax (sedative). ? A medicine to numb the area (local anesthetic). This medicine may be sprayed into your throat. It will make you feel more comfortable and keep you from gagging or coughing during the procedure. ? A medicine for pain.  A mouth guard may be placed in your mouth to protect your teeth and to keep you from biting on the endoscope.  You will be asked to lie on your left side.  The endoscope will be lowered down your throat into your esophagus, stomach, and  duodenum.  Air will be put into the endoscope. This will help your health care provider see better.  The lining of your esophagus, stomach, and duodenum will be examined.  Your health care provider may: ? Take a tissue sample so it can be looked at in a lab (biopsy). ? Remove growths. ? Remove objects (foreign bodies) that are stuck. ? Treat any bleeding with medicines or other devices that stop tissue from bleeding. ? Widen (dilate) or stretch narrowed areas of your esophagus and stomach.  The endoscope will be taken out. The procedure may vary among health care providers and hospitals. What happens after the procedure?  Your blood pressure, heart rate, breathing rate, and blood oxygen level will be monitored often until the medicines you were given have worn off.  Do not eat or drink anything until the numbing medicine has worn off and your gag reflex has returned. This information is not intended to replace advice given to you by your health care provider. Make sure you discuss any questions you have with your health care provider. Document Released: 05/23/2004 Document Revised: 06/28/2015 Document Reviewed: 12/14/2014 Elsevier Interactive Patient Education  2018 [employer]. Esophagogastroduodenoscopy, Care After Refer to this sheet in the next few weeks. These instructions provide you with information about caring for yourself after your procedure. Your health care provider may also give you more specific instructions. Your treatment has been planned according to current medical practices, but problems sometimes occur. Call your health care provider if you have any problems or questions after your procedure. What can I expect after the procedure? After the procedure, it is common to have:  A sore throat.  Nausea.  Bloating.  Dizziness.  Fatigue.  Follow these instructions at home:  Do not eat or drink anything until the numbing medicine (local anesthetic) has worn off  and your gag reflex has returned. You will know that the local anesthetic has worn off when you can swallow comfortably.  Do not drive for 24 hours if you received a medicine to help you relax (sedative).  If your health care provider took a tissue sample for testing during the procedure, make sure to get your test results. This is your responsibility. Ask your health care provider or the department performing the test when your results will be ready.  Keep all follow-up visits as told by your health care provider. This is important. Contact a health care provider if:  You cannot stop coughing.  You are not urinating.  You are urinating less than usual. Get help right away if:  You have trouble swallowing.  You cannot eat or drink.  You have throat or chest pain that gets worse.  You are dizzy or light-headed.  You faint.  You have nausea or vomiting.  You have chills.  You have a fever.  You have severe abdominal pain.  You have black, tarry, or bloody stools. This information  is not intended to replace advice given to you by your health care provider. Make sure you discuss any questions you have with your health care provider. Document Released: 01/07/2012 Document Revised: 06/28/2015 Document Reviewed: 12/14/2014 Elsevier Interactive Patient Education  2018 [employer].  Esophageal Dilatation Esophageal dilatation is a procedure to open a blocked or narrowed part of the esophagus. The esophagus is the long tube in your throat that carries food and liquid from your mouth to your stomach. The procedure is also called esophageal dilation. You may need this procedure if you have a buildup of scar tissue in your esophagus that makes it difficult, painful, or even impossible to swallow. This can be caused by gastroesophageal reflux disease (GERD). In rare cases, people need this procedure because they have cancer of the esophagus or a problem with the way food moves through  the esophagus. Sometimes you may need to have another dilatation to enlarge the opening of the esophagus gradually. Tell a health care provider about:  Any allergies you have.  All medicines you are taking, including vitamins, herbs, eye drops, creams, and over-the-counter medicines.  Any problems you or family members have had with anesthetic medicines.  Any blood disorders you have.  Any surgeries you have had.  Any medical conditions you have.  Any antibiotic medicines you are required to take before dental procedures. What are the risks? Generally, this is a safe procedure. However, problems can occur and include:  Bleeding from a tear in the lining of the esophagus.  A hole (perforation) in the esophagus.  What happens before the procedure?  Do not eat or drink anything after midnight on the night before the procedure or as directed by your health care provider.  Ask your health care provider about changing or stopping your regular medicines. This is especially important if you are taking diabetes medicines or blood thinners.  Plan to have someone take you home after the procedure. What happens during the procedure?  You will be given a medicine that makes you relaxed and sleepy (sedative).  A medicine may be sprayed or gargled to numb the back of the throat.  Your health care provider can use various instruments to do an esophageal dilatation. During the procedure, the instrument used will be placed in your mouth and passed down into your esophagus. Options include: ? Simple dilators. This instrument is carefully placed in the esophagus to stretch it. ? Guided wire bougies. In this method, a flexible tube (endoscope) is used to insert a wire into the esophagus. The dilator is passed over this wire to enlarge the esophagus. Then the wire is removed. ? Balloon dilators. An endoscope with a small balloon at the end is passed down into the esophagus. Inflating the balloon  gently stretches the esophagus and opens it up. What happens after the procedure?  Your blood pressure, heart rate, breathing rate, and blood oxygen level will be monitored often until the medicines you were given have worn off.  Your throat may feel slightly sore and will probably still feel numb. This will improve slowly over time.  You will not be allowed to eat or drink until the throat numbness has resolved.  If this is a same-day procedure, you may be allowed to go home once you have been able to drink, urinate, and sit on the edge of the bed without nausea or dizziness.  If this is a same-day procedure, you should have a friend or family member with you for the  next 24 hours after the procedure. This information is not intended to replace advice given to you by your health care provider. Make sure you discuss any questions you have with your health care provider. Document Released: 03/13/2005 Document Revised: 06/28/2015 Document Reviewed: 06/01/2013 Elsevier Interactive Patient Education  [employer]2018 [employer]  Colonoscopy, Adult A colonoscopy is an exam to look at the large intestine. It is done to check for problems, such as:  Lumps (tumors).  Growths (polyps).  Swelling (inflammation).  Bleeding.  What happens before the procedure? Eating and drinking Follow instructions from your doctor about eating and drinking. These instructions may include:  A few days before the procedure - follow a low-fiber diet. ? Avoid nuts. ? Avoid seeds. ? Avoid dried fruit. ? Avoid raw fruits. ? Avoid vegetables.  1-3 days before the procedure - follow a clear liquid diet. Avoid liquids that have red or purple dye. Drink only clear liquids, such as: ? Clear broth or bouillon. ? Black coffee or tea. ? Clear juice. ? Clear soft drinks or sports drinks. ? Gelatin dessert. ? Popsicles.  On the day of the procedure - do not eat or drink anything during the 2 hours before the  procedure.  Bowel prep If you were prescribed an oral bowel prep:  Take it as told by your doctor. Starting the day before your procedure, you will need to drink a lot of liquid. The liquid will cause you to poop (have bowel movements) until your poop is almost clear or light green.  If your skin or butt gets irritated from diarrhea, you may: ? Wipe the area with wipes that have medicine in them, such as adult wet wipes with aloe and vitamin E. ? Put something on your skin that soothes the area, such as petroleum jelly.  If you throw up (vomit) while drinking the bowel prep, take a break for up to 60 minutes. Then begin the bowel prep again. If you keep throwing up and you cannot take the bowel prep without throwing up, call your doctor.  General instructions  Ask your doctor about changing or stopping your normal medicines. This is important if you take diabetes medicines or blood thinners.  Plan to have someone take you home from the hospital or clinic. What happens during the procedure?  An IV tube may be put into one of your veins.  You will be given medicine to help you relax (sedative).  To reduce your risk of infection: ? Your doctors will wash their hands. ? Your anal area will be washed with soap.  You will be asked to lie on your side with your knees bent.  Your doctor will get a long, thin, flexible tube ready. The tube will have a camera and a light on the end.  The tube will be put into your anus.  The tube will be gently put into your large intestine.  Air will be delivered into your large intestine to keep it open. You may feel some pressure or cramping.  The camera will be used to take photos.  A small tissue sample may be removed from your body to be looked at under a microscope (biopsy). If any possible problems are found, the tissue will be sent to a lab for testing.  If small growths are found, your doctor may remove them and have them checked for  cancer.  The tube that was put into your anus will be slowly removed. The procedure may vary among doctors  and hospitals. What happens after the procedure?  Your doctor will check on you often until the medicines you were given have worn off.  Do not drive for 24 hours after the procedure.  You may have a small amount of blood in your poop.  You may pass gas.  You may have mild cramps or bloating in your belly (abdomen).  It is up to you to get the results of your procedure. Ask your doctor, or the department performing the procedure, when your results will be ready. This information is not intended to replace advice given to you by your health care provider. Make sure you discuss any questions you have with your health care provider. Document Released: 02/22/2010 Document Revised: 11/21/2015 Document Reviewed: 04/03/2015 Elsevier Interactive Patient Education  2017 [employer] Inc.  Colonoscopy, Adult, Care After This sheet gives you information about how to care for yourself after your procedure. Your health care provider may also give you more specific instructions. If you have problems or questions, contact your health care provider. What can I expect after the procedure? After the procedure, it is common to have:  A small amount of blood in your stool for 24 hours after the procedure.  Some gas.  Mild abdominal cramping or bloating.  Follow these instructions at home: General instructions   For the first 24 hours after the procedure: ? Do not drive or use machinery. ? Do not sign important documents. ? Do not drink alcohol. ? Do your regular daily activities at a slower pace than normal. ? Eat soft, easy-to-digest foods. ? Rest often.  Take over-the-counter or prescription medicines only as told by your health care provider.  It is up to you to get the results of your procedure. Ask your health care provider, or the department performing the procedure, when your  results will be ready. Relieving cramping and bloating  Try walking around when you have cramps or feel bloated.  Apply heat to your abdomen as told by your health care provider. Use a heat source that your health care provider recommends, such as a moist heat pack or a heating pad. ? Place a towel between your skin and the heat source. ? Leave the heat on for 20-30 minutes. ? Remove the heat if your skin turns bright red. This is especially important if you are unable to feel pain, heat, or cold. You may have a greater risk of getting burned. Eating and drinking  Drink enough fluid to keep your urine clear or pale yellow.  Resume your normal diet as instructed by your health care provider. Avoid heavy or fried foods that are hard to digest.  Avoid drinking alcohol for as long as instructed by your health care provider. Contact a health care provider if:  You have blood in your stool 2-3 days after the procedure. Get help right away if:  You have more than a small spotting of blood in your stool.  You pass large blood clots in your stool.  Your abdomen is swollen.  You have nausea or vomiting.  You have a fever.  You have increasing abdominal pain that is not relieved with medicine. This information is not intended to replace advice given to you by your health care provider. Make sure you discuss any questions you have with your health care provider. Document Released: 09/04/2003 Document Revised: 10/15/2015 Document Reviewed: 04/03/2015 Elsevier Interactive Patient Education  2018 [employer].  Monitored Anesthesia Care Anesthesia is a term that refers to techniques,  procedures, and medicines that help a person stay safe and comfortable during a medical procedure. Monitored anesthesia care, or sedation, is one type of anesthesia. Your anesthesia specialist may recommend sedation if you will be having a procedure that does not require you to be unconscious, such as:  Cataract  surgery.  A dental procedure.  A biopsy.  A colonoscopy.  During the procedure, you may receive a medicine to help you relax (sedative). There are three levels of sedation:  Mild sedation. At this level, you may feel awake and relaxed. You will be able to follow directions.  Moderate sedation. At this level, you will be sleepy. You may not remember the procedure.  Deep sedation. At this level, you will be asleep. You will not remember the procedure.  The more medicine you are given, the deeper your level of sedation will be. Depending on how you respond to the procedure, the anesthesia specialist may change your level of sedation or the type of anesthesia to fit your needs. An anesthesia specialist will monitor you closely during the procedure. Let your health care provider know about:  Any allergies you have.  All medicines you are taking, including vitamins, herbs, eye drops, creams, and over-the-counter medicines.  Any use of steroids (by mouth or as a cream).  Any problems you or family members have had with sedatives and anesthetic medicines.  Any blood disorders you have.  Any surgeries you have had.  Any medical conditions you have, such as sleep apnea.  Whether you are pregnant or may be pregnant.  Any use of cigarettes, alcohol, or street drugs. What are the risks? Generally, this is a safe procedure. However, problems may occur, including:  Getting too much medicine (oversedation).  Nausea.  Allergic reaction to medicines.  Trouble breathing. If this happens, a breathing tube may be used to help with breathing. It will be removed when you are awake and breathing on your own.  Heart trouble.  Lung trouble.  Before the procedure Staying hydrated Follow instructions from your health care provider about hydration, which may include:  Up to 2 hours before the procedure - you may continue to drink clear liquids, such as water, clear fruit juice, black  coffee, and plain tea.  Eating and drinking restrictions Follow instructions from your health care provider about eating and drinking, which may include:  8 hours before the procedure - stop eating heavy meals or foods such as meat, fried foods, or fatty foods.  6 hours before the procedure - stop eating light meals or foods, such as toast or cereal.  6 hours before the procedure - stop drinking milk or drinks that contain milk.  2 hours before the procedure - stop drinking clear liquids.  Medicines Ask your health care provider about:  Changing or stopping your regular medicines. This is especially important if you are taking diabetes medicines or blood thinners.  Taking medicines such as aspirin and ibuprofen. These medicines can thin your blood. Do not take these medicines before your procedure if your health care provider instructs you not to.  Tests and exams  You will have a physical exam.  You may have blood tests done to show: ? How well your kidneys and liver are working. ? How well your blood can clot.  General instructions  Plan to have someone take you home from the hospital or clinic.  If you will be going home right after the procedure, plan to have someone with you for 24 hours.  What happens during the procedure?  Your blood pressure, heart rate, breathing, level of pain and overall condition will be monitored.  An IV tube will be inserted into one of your veins.  Your anesthesia specialist will give you medicines as needed to keep you comfortable during the procedure. This may mean changing the level of sedation.  The procedure will be performed. After the procedure  Your blood pressure, heart rate, breathing rate, and blood oxygen level will be monitored until the medicines you were given have worn off.  Do not drive for 24 hours if you received a sedative.  You may: ? Feel sleepy, clumsy, or nauseous. ? Feel forgetful about what happened after the  procedure. ? Have a sore throat if you had a breathing tube during the procedure. ? Vomit. This information is not intended to replace advice given to you by your health care provider. Make sure you discuss any questions you have with your health care provider. Document Released: 10/16/2004 Document Revised: 06/29/2015 Document Reviewed: 05/13/2015 Elsevier Interactive Patient Education  2018 [employer] Monitored Anesthesia Care, Care After These instructions provide you with information about caring for yourself after your procedure. Your health care provider may also give you more specific instructions. Your treatment has been planned according to current medical practices, but problems sometimes occur. Call your health care provider if you have any problems or questions after your procedure. What can I expect after the procedure? After your procedure, it is common to:  Feel sleepy for several hours.  Feel clumsy and have poor balance for several hours.  Feel forgetful about what happened after the procedure.  Have poor judgment for several hours.  Feel nauseous or vomit.  Have a sore throat if you had a breathing tube during the procedure.  Follow these instructions at home: For at least 24 hours after the procedure:   Do not: ? Participate in activities in which you could fall or become injured. ? Drive. ? Use heavy machinery. ? Drink alcohol. ? Take sleeping pills or medicines that cause drowsiness. ? Make important decisions or sign legal documents. ? Take care of children on your own.  Rest. Eating and drinking  Follow the diet that is recommended by your health care provider.  If you vomit, drink water, juice, or soup when you can drink without vomiting.  Make sure you have little or no nausea before eating solid foods. General instructions  Have a responsible adult stay with you until you are awake and alert.  Take over-the-counter and prescription  medicines only as told by your health care provider.  If you smoke, do not smoke without supervision.  Keep all follow-up visits as told by your health care provider. This is important. Contact a health care provider if:  You keep feeling nauseous or you keep vomiting.  You feel light-headed.  You develop a rash.  You have a fever. Get help right away if:  You have trouble breathing. This information is not intended to replace advice given to you by your health care provider. Make sure you discuss any questions you have with your health care provider. Document Released: 05/13/2015 Document Revised: 09/12/2015 Document Reviewed: 05/13/2015 Elsevier Interactive Patient Education  [employer].

## 2018-01-13 ENCOUNTER — Ambulatory Visit (HOSPITAL_COMMUNITY)
Admission: RE | Admit: 2018-01-13 | Discharge: 2018-01-13 | Disposition: A | Discharge status: Home / Self Care | Payer: Managed Care, Other (non HMO) | Source: Ambulatory Visit | Attending: Nurse Practitioner | Admitting: Nurse Practitioner

## 2018-01-13 ENCOUNTER — Other Ambulatory Visit (HOSPITAL_COMMUNITY)
Admission: RE | Admit: 2018-01-13 | Discharge: 2018-01-13 | Disposition: A | Discharge status: Home / Self Care | Payer: Managed Care, Other (non HMO) | Source: Ambulatory Visit | Attending: Nurse Practitioner | Admitting: Nurse Practitioner

## 2018-01-13 ENCOUNTER — Other Ambulatory Visit: Payer: Self-pay

## 2018-01-13 DIAGNOSIS — R1310 Dysphagia, unspecified: Secondary | ICD-10-CM | POA: Diagnosis present

## 2018-01-13 DIAGNOSIS — R112 Nausea with vomiting, unspecified: Secondary | ICD-10-CM | POA: Diagnosis not present

## 2018-01-13 DIAGNOSIS — R1010 Upper abdominal pain, unspecified: Secondary | ICD-10-CM

## 2018-01-13 DIAGNOSIS — K219 Gastro-esophageal reflux disease without esophagitis: Secondary | ICD-10-CM | POA: Diagnosis not present

## 2018-01-13 DIAGNOSIS — R1319 Other dysphagia: Secondary | ICD-10-CM

## 2018-01-13 LAB — CBC WITH DIFFERENTIAL/PLATELET
ABS IMMATURE GRANULOCYTES: 0.04 10*3/uL (ref 0.00–0.07)
Basophils Absolute: 0.1 10*3/uL (ref 0.0–0.1)
Basophils Relative: 1 %
Eosinophils Absolute: 0.1 10*3/uL (ref 0.0–0.5)
Eosinophils Relative: 1 %
HEMATOCRIT: 39.2 % (ref 36.0–46.0)
Hemoglobin: 12.8 g/dL (ref 12.0–15.0)
Immature Granulocytes: 1 %
Lymphocytes Relative: 34 %
Lymphs Abs: 3 10*3/uL (ref 0.7–4.0)
MCH: 30 pg (ref 26.0–34.0)
MCHC: 32.7 g/dL (ref 30.0–36.0)
MCV: 91.8 fL (ref 80.0–100.0)
Monocytes Absolute: 0.8 10*3/uL (ref 0.1–1.0)
Monocytes Relative: 9 %
Neutro Abs: 4.9 10*3/uL (ref 1.7–7.7)
Neutrophils Relative %: 54 %
Platelets: 371 10*3/uL (ref 150–400)
RBC: 4.27 MIL/uL (ref 3.87–5.11)
RDW: 14.1 % (ref 11.5–15.5)
WBC: 8.9 10*3/uL (ref 4.0–10.5)
nRBC: 0 % (ref 0.0–0.2)

## 2018-01-13 LAB — COMPREHENSIVE METABOLIC PANEL
ALBUMIN: 3.6 g/dL (ref 3.5–5.0)
ALT: 45 U/L — AB (ref 0–44)
AST: 80 U/L — AB (ref 15–41)
Alkaline Phosphatase: 39 U/L (ref 38–126)
Anion gap: 9 (ref 5–15)
BUN: 18 mg/dL (ref 6–20)
CO2: 24 mmol/L (ref 22–32)
Calcium: 9 mg/dL (ref 8.9–10.3)
Chloride: 103 mmol/L (ref 98–111)
Creatinine, Ser: 0.46 mg/dL (ref 0.44–1.00)
GFR calc Af Amer: >60 mL/min (ref >60)
Glucose, Bld: 99 mg/dL (ref 70–99)
Potassium: 3.8 mmol/L (ref 3.5–5.1)
Sodium: 136 mmol/L (ref 135–145)
Total Bilirubin: 0.5 mg/dL (ref 0.3–1.2)
Total Protein: 7 g/dL (ref 6.5–8.1)

## 2018-01-13 LAB — LIPASE, BLOOD: Lipase: 49 U/L (ref 11–51)

## 2018-01-14 ENCOUNTER — Encounter (HOSPITAL_COMMUNITY): Payer: Self-pay

## 2018-01-14 ENCOUNTER — Encounter (HOSPITAL_COMMUNITY)
Admission: RE | Admit: 2018-01-14 | Discharge: 2018-01-14 | Disposition: A | Discharge status: Home / Self Care | Payer: Managed Care, Other (non HMO) | Source: Ambulatory Visit | Attending: Internal Medicine | Admitting: Internal Medicine

## 2018-01-18 ENCOUNTER — Encounter: Payer: Self-pay | Admitting: *Deleted

## 2018-01-18 ENCOUNTER — Other Ambulatory Visit: Payer: Self-pay | Admitting: *Deleted

## 2018-01-18 ENCOUNTER — Other Ambulatory Visit (HOSPITAL_COMMUNITY)
Admission: RE | Admit: 2018-01-18 | Discharge: 2018-01-18 | Disposition: A | Discharge status: Home / Self Care | Payer: Managed Care, Other (non HMO) | Source: Ambulatory Visit | Attending: Nurse Practitioner | Admitting: Nurse Practitioner

## 2018-01-18 DIAGNOSIS — K219 Gastro-esophageal reflux disease without esophagitis: Secondary | ICD-10-CM | POA: Insufficient documentation

## 2018-01-18 DIAGNOSIS — R1310 Dysphagia, unspecified: Secondary | ICD-10-CM | POA: Diagnosis present

## 2018-01-18 DIAGNOSIS — R1010 Upper abdominal pain, unspecified: Secondary | ICD-10-CM | POA: Insufficient documentation

## 2018-01-18 DIAGNOSIS — R112 Nausea with vomiting, unspecified: Secondary | ICD-10-CM | POA: Diagnosis present

## 2018-01-18 MED ORDER — NA SULFATE-K SULFATE-MG SULF 17.5-3.13-1.6 GM/177ML PO SOLN: 1.0000 | ORAL | 0 refills | Status: DC

## 2018-01-19 ENCOUNTER — Telehealth: Payer: Self-pay | Admitting: *Deleted

## 2018-01-19 LAB — GASTROINTESTINAL PANEL BY PCR, STOOL (REPLACES STOOL CULTURE)
Adenovirus F40/41: NOT DETECTED
Astrovirus: NOT DETECTED
CRYPTOSPORIDIUM: NOT DETECTED
CYCLOSPORA CAYETANENSIS: NOT DETECTED
Campylobacter species: NOT DETECTED
Entamoeba histolytica: NOT DETECTED
Enteroaggregative E coli (EAEC): NOT DETECTED
Enteropathogenic E coli (EPEC): NOT DETECTED
Enterotoxigenic E coli (ETEC): NOT DETECTED
Giardia lamblia: NOT DETECTED
Norovirus GI/GII: NOT DETECTED
Plesimonas shigelloides: NOT DETECTED
Rotavirus A: NOT DETECTED
SHIGELLA/ENTEROINVASIVE E COLI (EIEC): NOT DETECTED
Salmonella species: NOT DETECTED
Sapovirus (I, II, IV, and V): NOT DETECTED
Shiga like toxin producing E coli (STEC): NOT DETECTED
VIBRIO SPECIES: NOT DETECTED
Vibrio cholerae: NOT DETECTED
YERSINIA ENTEROCOLITICA: NOT DETECTED

## 2018-01-19 NOTE — Telephone Encounter (Signed)
Misty StanleyStacey received a call from patient questioning if she needs an enema.  Called patient and made aware no enema is needed. Patient has found instructions. I advised her with new prep, no enema is needed. She voiced understanding.

## 2018-01-21 ENCOUNTER — Encounter (HOSPITAL_COMMUNITY): Payer: Self-pay | Admitting: *Deleted

## 2018-01-21 ENCOUNTER — Ambulatory Visit (HOSPITAL_COMMUNITY): Payer: Managed Care, Other (non HMO) | Admitting: Anesthesiology

## 2018-01-21 ENCOUNTER — Ambulatory Visit (HOSPITAL_COMMUNITY)
Admission: RE | Admit: 2018-01-21 | Discharge: 2018-01-21 | Disposition: A | Discharge status: Home / Self Care | Payer: Managed Care, Other (non HMO) | Attending: Internal Medicine | Admitting: Internal Medicine

## 2018-01-21 ENCOUNTER — Encounter (HOSPITAL_COMMUNITY)
Admission: RE | Disposition: A | Discharge status: Home / Self Care | Payer: Self-pay | Source: Home / Self Care | Attending: Internal Medicine

## 2018-01-21 DIAGNOSIS — K580 Irritable bowel syndrome with diarrhea: Secondary | ICD-10-CM | POA: Diagnosis not present

## 2018-01-21 DIAGNOSIS — F17210 Nicotine dependence, cigarettes, uncomplicated: Secondary | ICD-10-CM | POA: Diagnosis not present

## 2018-01-21 DIAGNOSIS — K626 Ulcer of anus and rectum: Secondary | ICD-10-CM | POA: Diagnosis not present

## 2018-01-21 DIAGNOSIS — I10 Essential (primary) hypertension: Secondary | ICD-10-CM | POA: Diagnosis not present

## 2018-01-21 DIAGNOSIS — K219 Gastro-esophageal reflux disease without esophagitis: Secondary | ICD-10-CM | POA: Insufficient documentation

## 2018-01-21 DIAGNOSIS — R112 Nausea with vomiting, unspecified: Secondary | ICD-10-CM

## 2018-01-21 DIAGNOSIS — Z79899 Other long term (current) drug therapy: Secondary | ICD-10-CM | POA: Insufficient documentation

## 2018-01-21 DIAGNOSIS — K644 Residual hemorrhoidal skin tags: Secondary | ICD-10-CM | POA: Insufficient documentation

## 2018-01-21 DIAGNOSIS — K228 Other specified diseases of esophagus: Secondary | ICD-10-CM | POA: Diagnosis not present

## 2018-01-21 DIAGNOSIS — Z7982 Long term (current) use of aspirin: Secondary | ICD-10-CM | POA: Diagnosis not present

## 2018-01-21 DIAGNOSIS — R1310 Dysphagia, unspecified: Secondary | ICD-10-CM | POA: Insufficient documentation

## 2018-01-21 DIAGNOSIS — K449 Diaphragmatic hernia without obstruction or gangrene: Secondary | ICD-10-CM | POA: Diagnosis not present

## 2018-01-21 DIAGNOSIS — R1319 Other dysphagia: Secondary | ICD-10-CM

## 2018-01-21 DIAGNOSIS — K921 Melena: Secondary | ICD-10-CM | POA: Insufficient documentation

## 2018-01-21 DIAGNOSIS — R1010 Upper abdominal pain, unspecified: Secondary | ICD-10-CM

## 2018-01-21 DIAGNOSIS — K625 Hemorrhage of anus and rectum: Secondary | ICD-10-CM

## 2018-01-21 DIAGNOSIS — J45909 Unspecified asthma, uncomplicated: Secondary | ICD-10-CM | POA: Diagnosis not present

## 2018-01-21 HISTORY — PX: BIOPSY: SHX5522

## 2018-01-21 HISTORY — PX: COLONOSCOPY WITH PROPOFOL: SHX5780

## 2018-01-21 HISTORY — PX: MALONEY DILATION: SHX5535

## 2018-01-21 HISTORY — PX: ESOPHAGOGASTRODUODENOSCOPY (EGD) WITH PROPOFOL: SHX5813

## 2018-01-21 SURGERY — COLONOSCOPY WITH PROPOFOL
Anesthesia: General

## 2018-01-21 MED ORDER — STERILE WATER FOR IRRIGATION IR SOLN
Status: DC | PRN
  Administered 2018-01-21: 1.5 mL

## 2018-01-21 MED ORDER — PROPOFOL 500 MG/50ML IV EMUL
INTRAVENOUS | Status: DC | PRN
  Administered 2018-01-21: 125 ug/kg/min via INTRAVENOUS
  Administered 2018-01-21 (×2): via INTRAVENOUS

## 2018-01-21 MED ORDER — ONDANSETRON HCL 4 MG/2ML IJ SOLN
INTRAMUSCULAR | Status: DC | PRN
  Administered 2018-01-21: 4 mg via INTRAVENOUS

## 2018-01-21 MED ORDER — PROPOFOL 10 MG/ML IV BOLUS
INTRAVENOUS | Status: DC | PRN
  Administered 2018-01-21: 30 mg via INTRAVENOUS

## 2018-01-21 MED ORDER — LACTATED RINGERS IV SOLN
INTRAVENOUS | Status: DC
  Administered 2018-01-21 (×2): via INTRAVENOUS

## 2018-01-21 MED ORDER — ONDANSETRON HCL 4 MG/2ML IJ SOLN
INTRAMUSCULAR | Status: AC
  Filled 2018-01-21: qty 2

## 2018-01-21 MED ORDER — KETAMINE HCL 10 MG/ML IJ SOLN
INTRAMUSCULAR | Status: DC | PRN
  Administered 2018-01-21 (×3): 10 mg via INTRAVENOUS

## 2018-01-21 MED ORDER — KETAMINE HCL 50 MG/5ML IJ SOSY
PREFILLED_SYRINGE | INTRAMUSCULAR | Status: AC
  Filled 2018-01-21: qty 5

## 2018-01-21 MED ORDER — CHLORHEXIDINE GLUCONATE CLOTH 2 % EX PADS: 6.0000 | MEDICATED_PAD | Freq: Once | CUTANEOUS | Status: DC

## 2018-01-21 NOTE — Transfer of Care (Signed)
Immediate Anesthesia Transfer of Care Note  Patient: Ruth Dixon  Procedure(s) Performed: COLONOSCOPY WITH PROPOFOL (N/A ) ESOPHAGOGASTRODUODENOSCOPY (EGD) WITH PROPOFOL (N/A ) MALONEY DILATION (N/A ) BIOPSY  Patient Location: PACU  Anesthesia Type:General  Level of Consciousness: awake and patient cooperative  Airway & Oxygen Therapy: Patient Spontanous Breathing  Post-op Assessment: Report given to RN, Post -op Vital signs reviewed and stable and Patient moving all extremities  Post vital signs: Reviewed and stable  Last Vitals:  Vitals Value Taken Time  BP    Temp    Pulse 102 01/21/2018  1:49 PM  Resp 13 01/21/2018  1:49 PM  SpO2 100 % 01/21/2018  1:49 PM  Vitals shown include unvalidated device data.  Last Pain:  Vitals:   01/21/18 1255  TempSrc:   PainSc: 0-No pain      Patients Stated Pain Goal: 6 (01/21/18 1221)  Complications: No apparent anesthesia complications

## 2018-01-21 NOTE — Anesthesia Preprocedure Evaluation (Signed)
Anesthesia Evaluation  Patient identified by MRN, date of birth, ID band Patient awake    Reviewed: Allergy & Precautions, H&P , NPO status , Patient's Chart, lab work & pertinent test results  Airway Mallampati: II  TM Distance: >3 FB Neck ROM: full    Dental no notable dental hx.    Pulmonary asthma , Current Smoker,    Pulmonary exam normal breath sounds clear to auscultation       Cardiovascular Exercise Tolerance: Good hypertension, negative cardio ROS   Rhythm:regular Rate:Normal     Neuro/Psych negative neurological ROS  negative psych ROS   GI/Hepatic Neg liver ROS, GERD  ,  Endo/Other  negative endocrine ROS  Renal/GU negative Renal ROS  negative genitourinary   Musculoskeletal   Abdominal   Peds  Hematology negative hematology ROS (+)   Anesthesia Other Findings   Reproductive/Obstetrics negative OB ROS                             Anesthesia Physical Anesthesia Plan  ASA: III  Anesthesia Plan: General   Post-op Pain Management:    Induction:   PONV Risk Score and Plan:   Airway Management Planned:   Additional Equipment:   Intra-op Plan:   Post-operative Plan:   Informed Consent: I have reviewed the patients History and Physical, chart, labs and discussed the procedure including the risks, benefits and alternatives for the proposed anesthesia with the patient or authorized representative who has indicated his/her understanding and acceptance.   Dental Advisory Given  Plan Discussed with: CRNA  Anesthesia Plan Comments:         Anesthesia Quick Evaluation

## 2018-01-21 NOTE — Interval H&P Note (Signed)
History and Physical Interval Note:  01/21/2018 12:36 PM  Ruth Dixon  has presented today for surgery, with the diagnosis of epigastric pain, n/v, rectal bleeding, gerd, dysphagia  The various methods of treatment have been discussed with the patient and family. After consideration of risks, benefits and other options for treatment, the patient has consented to  Procedure(s) with comments: COLONOSCOPY WITH PROPOFOL (N/A) - 12:45pm ESOPHAGOGASTRODUODENOSCOPY (EGD) WITH PROPOFOL (N/A) MALONEY DILATION (N/A) as a surgical intervention .  The patient's history has been reviewed, patient examined, no change in status, stable for surgery.  I have reviewed the patient's chart and labs.  Questions were answered to the patient's satisfaction.     Briana Newman  No change.  EGD/ED and colonoscopy today per plan.  The risks, benefits, limitations, imponderables and alternatives regarding both EGD and colonoscopy have been reviewed with the patient. Questions have been answered. All parties agreeable.

## 2018-01-21 NOTE — Op Note (Signed)
[facility] Patient Name: Ruth Dixon Procedure Date: 01/21/2018 1:20 PM MRN: [id number] Date of Birth: 01/20/1971 Attending MD: Gennette Pacobert Michael Edmund Holcomb , MD CSN: [id number] Age: [id number] Admit Type: Inpatient Procedure:                Colonoscopy Indications:              Hematochezia Providers:                Gennette Pacobert Michael Shaneta Cervenka, MD, Nena PolioLisa Moore, RN, Sterling Bigiffani                            Roberts, RN, Edythe ClarityKelly Cox, Technician Referring MD:              Medicines:                Propofol per Anesthesia Complications:            No immediate complications. Estimated Blood Loss:     Estimated blood loss was minimal. Procedure:                Pre-Anesthesia Assessment:                           - Prior to the procedure, a History and Physical                            was performed, and patient medications and                            allergies were reviewed. The patient's tolerance of                            previous anesthesia was also reviewed. The risks                            and benefits of the procedure and the sedation                            options and risks were discussed with the patient.                            All questions were answered, and informed consent                            was obtained. Prior Anticoagulants: The patient has                            taken no previous anticoagulant or antiplatelet                            agents. ASA Grade Assessment: II - A patient with                            mild systemic disease. After reviewing the risks  and benefits, the patient was deemed in                            satisfactory condition to undergo the procedure.                           - Prior to the procedure, a History and Physical                            was performed, and patient medications and                            allergies were reviewed. The patient's tolerance of                            previous  anesthesia was also reviewed. The risks                            and benefits of the procedure and the sedation                            options and risks were discussed with the patient.                            All questions were answered, and informed consent                            was obtained. Prior Anticoagulants: The patient has                            taken no previous anticoagulant or antiplatelet                            agents. ASA Grade Assessment: II - A patient with                            mild systemic disease. After reviewing the risks                            and benefits, the patient was deemed in                            satisfactory condition to undergo the procedure.                           After obtaining informed consent, the colonoscope                            was passed under direct vision. Throughout the                            procedure, the patient's blood pressure, pulse, and  oxygen saturations were monitored continuously. The                            [id number]-[id number] ([id number]) scope was introduced through                            the and advanced to the the cecum, identified by                            appendiceal orifice and ileocecal valve. The                            colonoscopy was performed without difficulty. The                            patient tolerated the procedure well. The quality                            of the bowel preparation was adequate. Scope In: 1:24:53 PM Scope Out: 1:42:53 PM Scope Withdrawal Time: 0 hours 12 minutes 41 seconds  Total Procedure Duration: 0 hours 18 minutes 0 seconds  Findings:      Grade 4 hemorrhoid tags present.      The perianal and digital rectal examinations were normal. Verrucous       changes at the anorectal junction on retroflexion      The colon (entire examined portion) appeared normal. Impression:               - The entire examined colon is  normal. Verrucous                            changes of the anorectal junction?"biopsied                           - No specimens collected. Moderate Sedation:      Moderate (conscious) sedation was personally administered by an       anesthesia professional. The following parameters were monitored: oxygen       saturation, heart rate, blood pressure, respiratory rate, EKG, adequacy       of pulmonary ventilation, and response to care. Recommendation:           - Patient has a contact number available for                            emergencies. The signs and symptoms of potential                            delayed complications were discussed with the                            patient. Return to normal activities tomorrow.                            Written discharge instructions were provided to the  patient.                           - Resume previous diet.                           - Continue present medications.                           - Await pathology results.                           - Repeat colonoscopy in 10 years for screening                            purposes.                           - Return to GI office in 2 months. See EGD report. Procedure Code(s):        --- Professional ---                           [id number], Colonoscopy, flexible; diagnostic, including                            collection of specimen(s) by brushing or washing,                            when performed (separate procedure) Diagnosis Code(s):        --- Professional ---                           K92.1, Melena (includes Hematochezia) CPT copyright 2018 American Medical Association. All rights reserved. The codes documented in this report are preliminary and upon coder review may  be revised to meet current compliance requirements. Gerrit Friendsobert M. Kevork Joyce, MD Gennette Pacobert Michael Schelly Chuba, MD 01/21/2018 1:50:10 PM This report has been signed electronically. Number of Addenda: 0

## 2018-01-21 NOTE — Discharge Instructions (Signed)
Colonoscopy Discharge Instructions  Read the instructions outlined below and refer to this sheet in the next few weeks. These discharge instructions provide you with general information on caring for yourself after you leave the hospital. Your doctor may also give you specific instructions. While your treatment has been planned according to the most current medical practices available, unavoidable complications occasionally occur. If you have any problems or questions after discharge, call Dr. Jena Gaussourk at [phone]. ACTIVITY  You may resume your regular activity, but move at a slower pace for the next 24 hours.   Take frequent rest periods for the next 24 hours.   Walking will help get rid of the air and reduce the bloated feeling in your belly (abdomen).   No driving for 24 hours (because of the medicine (anesthesia) used during the test).    Do not sign any important legal documents or operate any machinery for 24 hours (because of the anesthesia used during the test).  NUTRITION  Drink plenty of fluids.   You may resume your normal diet as instructed by your doctor.   Begin with a light meal and progress to your normal diet. Heavy or fried foods are harder to digest and may make you feel sick to your stomach (nauseated).   Avoid alcoholic beverages for 24 hours or as instructed.  MEDICATIONS  You may resume your normal medications unless your doctor tells you otherwise.  WHAT YOU CAN EXPECT TODAY  Some feelings of bloating in the abdomen.   Passage of more gas than usual.   Spotting of blood in your stool or on the toilet paper.  IF YOU HAD POLYPS REMOVED DURING THE COLONOSCOPY:  No aspirin products for 7 days or as instructed.   No alcohol for 7 days or as instructed.   Eat a soft diet for the next 24 hours.  FINDING OUT THE RESULTS OF YOUR TEST Not all test results are available during your visit. If your test results are not back during the visit, make an appointment  with your caregiver to find out the results. Do not assume everything is normal if you have not heard from your caregiver or the medical facility. It is important for you to follow up on all of your test results.  SEEK IMMEDIATE MEDICAL ATTENTION IF:  You have more than a spotting of blood in yo    ur stool.   Your belly is swollen (abdominal distention).   You are nauseated or vomiting.   You have a temperature over 101.   You have abdominal pain or discomfort that is severe or gets worse throughout the day.  The risks, benefits, limitations, alternatives and imponderables have been reviewed with the patient. Potential for esophageal dilation, biopsy, etc. have also been reviewed.  Questions have been answered. All parties agreeable.  GERD information provided  Increase Prevacid to 30 mg daily  First visit with us in 2 months  Further recommendations to follow pending review of pathology report  You may see a little bit of blood with your next bowel movement or 2 but it will taper off.            Gastroesophageal Reflux Disease, Adult Gastroesophageal reflux (GER) happens when acid from the stomach flows up into the tube that connects the mouth and the stomach (esophagus). Normally, food travels down the esophagus and stays in the stomach to be digested. With GER, food and stomach acid sometimes move back up into the esophagus. You may have  a disease called gastroesophageal reflux disease (GERD) if the reflux:  Happens often.  Causes frequent or very bad symptoms.  Causes problems such as damage to the esophagus. When this happens, the esophagus becomes sore and swollen (inflamed). Over time, GERD can make small holes (ulcers) in the lining of the esophagus. What are the causes? This condition is caused by a problem with the muscle between the esophagus and the stomach. When this muscle is weak or not normal, it does not close properly to keep food and acid from  coming back up from the stomach. The muscle can be weak because of:  Tobacco use.  Pregnancy.  Having a certain type of hernia (hiatal hernia).  Alcohol use.  Certain foods and drinks, such as coffee, chocolate, onions, and peppermint. What increases the risk? You are more likely to develop this condition if you:  Are overweight.  Have a disease that affects your connective tissue.  Use NSAID medicines. What are the signs or symptoms? Symptoms of this condition include:  Heartburn.  Difficult or painful swallowing.  The feeling of having a lump in the throat.  A bitter taste in the mouth.  Bad breath.  Having a lot of saliva.  Having an upset or bloated stomach.  Belching.  Chest pain. Different conditions can cause chest pain. Make sure you see your doctor if you have chest pain.  Shortness of breath or noisy breathing (wheezing).  Ongoing (chronic) cough or a cough at night.  Wearing away of the surface of teeth (tooth enamel).  Weight loss. How is this treated? Treatment will depend on how bad your symptoms are. Your doctor may suggest:  Changes to your diet.  Medicine.  Surgery. Follow these instructions at home: Eating and drinking   Follow a diet as told by your doctor. You may need to avoid foods and drinks such as: ? Coffee and tea (with or without caffeine). ? Drinks that contain alcohol. ? Energy drinks and sports drinks. ? Bubbly (carbonated) drinks or sodas. ? Chocolate and cocoa. ? Peppermint and mint flavorings. ? Garlic and onions. ? Horseradish. ? Spicy and acidic foods. These include peppers, chili powder, curry powder, vinegar, hot sauces, and BBQ sauce. ? Citrus fruit juices and citrus fruits, such as oranges, lemons, and limes. ? Tomato-based foods. These include red sauce, chili, salsa, and pizza with red sauce. ? Fried and fatty foods. These include donuts, french fries, potato chips, and high-fat dressings. ? High-fat  meats. These include hot dogs, rib eye steak, sausage, ham, and bacon. ? High-fat dairy items, such as whole milk, butter, and cream cheese.  Eat small meals often. Avoid eating large meals.  Avoid drinking large amounts of liquid with your meals.  Avoid eating meals during the 2-3 hours before bedtime.  Avoid lying down right after you eat.  Do not exercise right after you eat. Lifestyle   Do not use any products that contain nicotine or tobacco. These include cigarettes, e-cigarettes, and chewing tobacco. If you need help quitting, ask your doctor.  Try to lower your stress. If you need help doing this, ask your doctor.  If you are overweight, lose an amount of weight that is healthy for you. Ask your doctor about a safe weight loss goal. General instructions  Pay attention to any changes in your symptoms.  Take over-the-counter and prescription medicines only as told by your doctor. Do not take aspirin, ibuprofen, or other NSAIDs unless your doctor says it is okay.  Wear loose clothes. Do not wear anything tight around your waist.  Raise (elevate) the head of your bed about 6 inches (15 cm).  Avoid bending over if this makes your symptoms worse.  Keep all follow-up visits as told by your doctor. This is important. Contact a doctor if:  You have new symptoms.  You lose weight and you do not know why.  You have trouble swallowing or it hurts to swallow.  You have wheezing or a cough that keeps happening.  Your symptoms do not get better with treatment.  You have a hoarse voice. Get help right away if:  You have pain in your arms, neck, jaw, teeth, or back.  You feel sweaty, dizzy, or light-headed.  You have chest pain or shortness of breath.  You throw up (vomit) and your throw-up looks like blood or coffee grounds.  You pass out (faint).  Your poop (stool) is bloody or black.  You cannot swallow, drink, or eat. Summary  If a person has gastroesophageal  reflux disease (GERD), food and stomach acid move back up into the esophagus and cause symptoms or problems such as damage to the esophagus.  Treatment will depend on how bad your symptoms are.  Follow a diet as told by your doctor.  Take all medicines only as told by your doctor. This information is not intended to replace advice given to you by your health care provider. Make sure you discuss any questions you have with your health care provider. Document Released: 07/09/2007 Document Revised: 07/29/2017 Document Reviewed: 07/29/2017 Elsevier Interactive Patient Education  2019 [employer]     Monitored Anesthesia Care, Care After These instructions provide you with information about caring for yourself after your procedure. Your health care provider may also give you more specific instructions. Your treatment has been planned according to current medical practices, but problems sometimes occur. Call your health care provider if you have any problems or questions after your procedure. What can I expect after the procedure? After your procedure, you may:  Feel sleepy for several hours.  Feel clumsy and have poor balance for several hours.  Feel forgetful about what happened after the procedure.  Have poor judgment for several hours.  Feel nauseous or vomit.  Have a sore throat if you had a breathing tube during the procedure. Follow these instructions at home: For at least 24 hours after the procedure:      Have a responsible adult stay with you. It is important to have someone help care for you until you are awake and alert.  Rest as needed.  Do not: ? Participate in activities in which you could fall or become injured. ? Drive. ? Use heavy machinery. ? Drink alcohol. ? Take sleeping pills or medicines that cause drowsiness. ? Make important decisions or sign legal documents. ? Take care of children on your own. Eating and drinking  Follow the diet that is  recommended by your health care provider.  If you vomit, drink water, juice, or soup when you can drink without vomiting.  Make sure you have little or no nausea before eating solid foods. General instructions  Take over-the-counter and prescription medicines only as told by your health care provider.  If you have sleep apnea, surgery and certain medicines can increase your risk for breathing problems. Follow instructions from your health care provider about wearing your sleep device: ? Anytime you are sleeping, including during daytime naps. ? While taking prescription pain medicines, sleeping medicines,  or medicines that make you drowsy.  If you smoke, do not smoke without supervision.  Keep all follow-up visits as told by your health care provider. This is important. Contact a health care provider if:  You keep feeling nauseous or you keep vomiting.  You feel light-headed.  You develop a rash.  You have a fever. Get help right away if:  You have trouble breathing. Summary  For several hours after your procedure, you may feel sleepy and have poor judgment.  Have a responsible adult stay with you for at least 24 hours or until you are awake and alert. This information is not intended to replace advice given to you by your health care provider. Make sure you discuss any questions you have with your health care provider. Document Released: 05/13/2015 Document Revised: 09/05/2016 Document Reviewed: 05/13/2015 Elsevier Interactive Patient Education  2019 [employer].

## 2018-01-21 NOTE — Op Note (Signed)
[facility] Patient Name: Ruth Dixon Procedure Date: 01/21/2018 12:29 PM MRN: [id number] Date of Birth: 11-18-1970 Attending MD: Gennette Pac , MD CSN: [id number] Age: 47 Admit Type: Outpatient Procedure:                Upper GI endoscopy Indications:              Dysphagia Providers:                Gennette Pac, MD, Nena Polio, RN, Sterling Big, RN, Edythe Clarity, Technician Referring MD:             Corrie Mckusick MD, MD Medicines:                Propofol per Anesthesia Complications:            No immediate complications. Estimated Blood Loss:     Minimal Procedure:                Pre-Anesthesia Assessment:                           - Prior to the procedure, a History and Physical                            was performed, and patient medications and                            allergies were reviewed. The patient's tolerance of                            previous anesthesia was also reviewed. The risks                            and benefits of the procedure and the sedation                            options and risks were discussed with the patient.                            All questions were answered, and informed consent                            was obtained. Prior Anticoagulants: The patient has                            taken no previous anticoagulant or antiplatelet                            agents. ASA Grade Assessment: II - A patient with                            mild systemic disease. After reviewing the risks  and benefits, the patient was deemed in                            satisfactory condition to undergo the procedure.                           After obtaining informed consent, the endoscope was                            passed under direct vision. Throughout the                            procedure, the patient's blood pressure, pulse, and                            oxygen  saturations were monitored continuously. The                            [id number]-[id number] ([id number] scope was introduced through the                            and advanced to the second part of duodenum. The                            upper GI endoscopy was accomplished without                            difficulty. The patient tolerated the procedure                            well. Scope In: 1:07:31 PM Scope Out: 1:15:55 PM Total Procedure Duration: 0 hours 8 minutes 24 seconds  Findings:      Salmon-colored mucosa was present. Single tongue 2 cm of at the GE       junction. No nodularity. Esophagus appeared somewhat diffusely "ringed".      A medium-sized hiatal hernia was present.      The exam was otherwise without abnormality.      The duodenal bulb and second portion of the duodenum were normal. The       scope was withdrawn. Dilation was performed with a Maloney dilator with       mild resistance at 54 Fr. The dilation site was examined following       endoscope reinsertion and showed mild mucosal disruption. Estimated       blood loss was minimal. Finally, this was biopsied with a cold forceps       for histology. Estimated blood loss was minimal. No immediate       complications. Impression:               - Esophageal Salmon-colored mucosa and ringed                            esophagus - dilateed and Bx'd                           - Medium-sized hiatal hernia.                           -  The examination was otherwise normal.                           - Normal duodenal bulb and second portion of the                            duodenum.                           - No specimens collected. Moderate Sedation:      Moderate (conscious) sedation was personally administered by an       anesthesia professional. The following parameters were monitored: oxygen       saturation, heart rate, blood pressure, respiratory rate, EKG, adequacy       of pulmonary ventilation, and response to  care. Recommendation:           - Written discharge instructions were provided to                            the patient.                           - The signs and symptoms of potential delayed                            complications were discussed with the patient.                           - Patient has a contact number available for                            emergencies.                           - Return to normal activities tomorrow.                           - Advance diet as tolerated.                           - Continue present medications. Increase Prevacid                            to 30 mg once daily                           - No repeat upper endoscopy.                           - Return to GI office in 2 months. Colonoscopy                            report. Procedure Code(s):        --- Professional ---                           [phone] [id number], Esophagogastroduodenoscopy, flexible,  transoral; with biopsy, single or multiple                           43450, Dilation of esophagus, by unguided sound or                            bougie, single or multiple passes Diagnosis Code(s):        --- Professional ---                           K22.8, Other specified diseases of esophagus                           K44.9, Diaphragmatic hernia without obstruction or                            gangrene                           R13.10, Dysphagia, unspecified CPT copyright 2018 American Medical Association. All rights reserved. The codes documented in this report are preliminary and upon coder review may  be revised to meet current compliance requirements. Ruth Dixon. Ruth Janak, MD Gennette Pac, MD 01/21/2018 1:45:44 PM This report has been signed electronically. Number of Addenda: 0

## 2018-01-21 NOTE — Anesthesia Postprocedure Evaluation (Signed)
Anesthesia Post Note  Patient: Marni Griffonngela W Lobos  Procedure(s) Performed: COLONOSCOPY WITH PROPOFOL (N/A ) ESOPHAGOGASTRODUODENOSCOPY (EGD) WITH PROPOFOL (N/A ) MALONEY DILATION (N/A ) BIOPSY  Patient location during evaluation: PACU Anesthesia Type: General Level of consciousness: awake Pain management: pain level controlled Vital Signs Assessment: post-procedure vital signs reviewed and stable Respiratory status: spontaneous breathing, nonlabored ventilation and respiratory function stable Cardiovascular status: blood pressure returned to baseline Postop Assessment: no apparent nausea or vomiting Anesthetic complications: no     Last Vitals:  Vitals:   01/21/18 1221 01/21/18 1350  BP: 109/76   Pulse: 98   Resp: (!) 21   Temp: 36.9 C   SpO2:  100%    Last Pain:  Vitals:   01/21/18 1255  TempSrc:   PainSc: 0-No pain                 Majesta Leichter J

## 2018-01-23 ENCOUNTER — Encounter: Payer: Self-pay | Admitting: Internal Medicine

## 2018-01-26 ENCOUNTER — Other Ambulatory Visit: Payer: Self-pay | Admitting: Gastroenterology

## 2018-01-26 MED ORDER — ONDANSETRON HCL 4 MG PO TABS: 4.0000 mg | ORAL_TABLET | Freq: Three times a day (TID) | ORAL | 3 refills | Status: AC

## 2018-01-26 NOTE — Progress Notes (Signed)
PT said she has cut back on the alcohol. Not taking NSAIDS and will get the medications.

## 2018-01-26 NOTE — Progress Notes (Signed)
Ensure not drinking alcohol, avoiding NSAIDs, continue PPI. I sent in Zofran to take before meals. Further recommendations per Minerva AreolaEric when he returns.

## 2018-01-26 NOTE — Progress Notes (Signed)
PT is aware of results. Said she still has diarrhea, but always has it due to her IBS. She is most concerned with tender abdomen and abdominal pain above the belly button and maybe a tad to the left. Her biggest problem is the nausea that she has first thing in the morning,. The nausea medication is helping some, she is just frustrated with having the nausea. I am forwarding to Lewie LoronAnna Boone, NP in [place]'s absence.

## 2018-01-26 NOTE — Progress Notes (Signed)
LMOM to call.

## 2018-02-01 ENCOUNTER — Encounter (HOSPITAL_COMMUNITY): Payer: Self-pay | Admitting: Internal Medicine

## 2018-02-05 ENCOUNTER — Telehealth: Payer: Self-pay

## 2018-02-05 NOTE — Telephone Encounter (Signed)
She has been on lansoprazole for years. I recommended trial of Dexilant (samples were given) and progress report in a week; she never called [place] with result.  Please contact her and ask if she tried the Dexilant and if so, did it help.

## 2018-02-05 NOTE — Telephone Encounter (Signed)
EG received a denial letter from [facility] for Lansoprazole. Per the last OV note, Dexilant samples were given to pt. Is pt suppose to be on Dexilant instead of Lansoprazole?

## 2018-02-08 ENCOUNTER — Telehealth: Payer: Self-pay | Admitting: Internal Medicine

## 2018-02-08 NOTE — Telephone Encounter (Signed)
[phone] patient returned call please call back

## 2018-02-08 NOTE — Telephone Encounter (Signed)
Spoke with pt. She received a new letter from her insurance company and Lansoprazole is covered for $12.00 a month.

## 2018-02-08 NOTE — Telephone Encounter (Signed)
Tried calling pts cell, VM is full. Called home phone and pts boyfriend will have pt call back when she gets home.

## 2018-02-11 ENCOUNTER — Telehealth: Payer: Self-pay | Admitting: Internal Medicine

## 2018-02-11 NOTE — Telephone Encounter (Signed)
We received Short Term Disability papers on 02/10/2018 from [employer]. Patient is needing these filled out and faxed ASAP. I have placed papers in EG chair.

## 2018-02-17 ENCOUNTER — Telehealth: Payer: Self-pay | Admitting: Internal Medicine

## 2018-02-17 NOTE — Telephone Encounter (Signed)
[phone]  PATIENT WANTING TO KNOW WHEN THE DISABILITY PAPERS WILL BE FILLED OUT

## 2018-02-18 NOTE — Telephone Encounter (Signed)
Completed. See other phone note for details.

## 2018-02-18 NOTE — Telephone Encounter (Signed)
Pt.notified

## 2018-02-18 NOTE — Telephone Encounter (Signed)
Completed and placed on SS desk for processing.

## 2018-02-19 NOTE — Telephone Encounter (Signed)
Code entered, copies made for scanning °

## 2018-02-19 NOTE — Telephone Encounter (Signed)
Code entered, copies made for scanning

## 2018-04-19 ENCOUNTER — Telehealth: Payer: Self-pay

## 2018-04-19 NOTE — Telephone Encounter (Signed)
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 At this time due to covid-19.

## 2018-04-20 ENCOUNTER — Ambulatory Visit: Payer: Managed Care, Other (non HMO) | Admitting: Nurse Practitioner

## 2018-04-20 NOTE — Telephone Encounter (Signed)
Noted  

## 2018-07-17 ENCOUNTER — Other Ambulatory Visit: Payer: Self-pay | Admitting: Internal Medicine

## 2018-10-09 ENCOUNTER — Encounter (HOSPITAL_COMMUNITY): Payer: Self-pay | Admitting: Emergency Medicine

## 2018-10-09 ENCOUNTER — Emergency Department (HOSPITAL_COMMUNITY)
Admission: EM | Admit: 2018-10-09 | Discharge: 2018-10-09 | Disposition: A | Discharge status: Home / Self Care | Payer: 59 | Attending: Emergency Medicine | Admitting: Emergency Medicine

## 2018-10-09 ENCOUNTER — Other Ambulatory Visit: Payer: Self-pay

## 2018-10-09 ENCOUNTER — Emergency Department (HOSPITAL_COMMUNITY): Payer: 59

## 2018-10-09 DIAGNOSIS — S29011A Strain of muscle and tendon of front wall of thorax, initial encounter: Secondary | ICD-10-CM | POA: Insufficient documentation

## 2018-10-09 DIAGNOSIS — R0789 Other chest pain: Secondary | ICD-10-CM | POA: Diagnosis not present

## 2018-10-09 DIAGNOSIS — Z7982 Long term (current) use of aspirin: Secondary | ICD-10-CM | POA: Diagnosis not present

## 2018-10-09 DIAGNOSIS — Y999 Unspecified external cause status: Secondary | ICD-10-CM | POA: Diagnosis not present

## 2018-10-09 DIAGNOSIS — Z79899 Other long term (current) drug therapy: Secondary | ICD-10-CM | POA: Diagnosis not present

## 2018-10-09 DIAGNOSIS — Y9389 Activity, other specified: Secondary | ICD-10-CM | POA: Diagnosis not present

## 2018-10-09 DIAGNOSIS — F17210 Nicotine dependence, cigarettes, uncomplicated: Secondary | ICD-10-CM | POA: Insufficient documentation

## 2018-10-09 DIAGNOSIS — Y929 Unspecified place or not applicable: Secondary | ICD-10-CM | POA: Insufficient documentation

## 2018-10-09 DIAGNOSIS — I10 Essential (primary) hypertension: Secondary | ICD-10-CM | POA: Insufficient documentation

## 2018-10-09 DIAGNOSIS — X500XXA Overexertion from strenuous movement or load, initial encounter: Secondary | ICD-10-CM | POA: Insufficient documentation

## 2018-10-09 DIAGNOSIS — R52 Pain, unspecified: Secondary | ICD-10-CM | POA: Diagnosis present

## 2018-10-09 LAB — URINALYSIS, ROUTINE W REFLEX MICROSCOPIC
Bilirubin Urine: NEGATIVE
Glucose, UA: NEGATIVE mg/dL
Ketones, ur: NEGATIVE mg/dL
Leukocytes,Ua: NEGATIVE
Nitrite: NEGATIVE
Protein, ur: NEGATIVE mg/dL
Specific Gravity, Urine: 1.009 (ref 1.005–1.030)
pH: 6 (ref 5.0–8.0)

## 2018-10-09 LAB — D-DIMER, QUANTITATIVE: D-Dimer, Quant: 0.4 ug/mL-FEU (ref 0.00–0.50)

## 2018-10-09 MED ORDER — CYCLOBENZAPRINE HCL 10 MG PO TABS: 10.0000 mg | ORAL_TABLET | Freq: Three times a day (TID) | ORAL | 0 refills | Status: DC

## 2018-10-09 MED ORDER — PROCHLORPERAZINE EDISYLATE 10 MG/2ML IJ SOLN
5.0000 mg | Freq: Once | INTRAMUSCULAR | Status: AC
  Administered 2018-10-09: 5 mg via INTRAVENOUS
  Filled 2018-10-09: qty 2

## 2018-10-09 MED ORDER — PREDNISONE 20 MG PO TABS
40.0000 mg | ORAL_TABLET | Freq: Once | ORAL | Status: AC
  Administered 2018-10-09: 40 mg via ORAL
  Filled 2018-10-09: qty 2

## 2018-10-09 MED ORDER — DEXAMETHASONE 4 MG PO TABS: 4.0000 mg | ORAL_TABLET | Freq: Two times a day (BID) | ORAL | 0 refills | Status: DC

## 2018-10-09 MED ORDER — IBUPROFEN 600 MG PO TABS: 600.0000 mg | ORAL_TABLET | Freq: Four times a day (QID) | ORAL | 0 refills | Status: DC

## 2018-10-09 MED ORDER — ALBUTEROL SULFATE HFA 108 (90 BASE) MCG/ACT IN AERS
4.0000 | INHALATION_SPRAY | Freq: Once | RESPIRATORY_TRACT | Status: AC
  Administered 2018-10-09: 4 via RESPIRATORY_TRACT
  Filled 2018-10-09: qty 6.7

## 2018-10-09 MED ORDER — HYDROMORPHONE HCL 1 MG/ML IJ SOLN
0.5000 mg | Freq: Once | INTRAMUSCULAR | Status: AC
  Administered 2018-10-09: 15:00:00 0.5 mg via INTRAVENOUS
  Filled 2018-10-09: qty 1

## 2018-10-09 NOTE — ED Notes (Addendum)
Asked pt for urine sample pt stated she didn't need to go

## 2018-10-09 NOTE — Discharge Instructions (Signed)
The x-ray of your ribs is negative for fracture or dislocation.  The x-ray of your chest is negative for collapsed lung, pneumonia, or other acute abnormality.  The examination does show some coarse breath sounds and findings possibly consistent with early chronic lung disease.  This is probably related to your smoking.  Your urine test is negative for urinary tract infection, or kidney stone.  Please use a heating pad to the area of your chest and flank that is causing problem.  Please use your incentive spirometer several times each hour to ensure that you are completely expanding your lungs.  Use Flexeril 3 times daily for spasm pain.  This medication may cause drowsiness, please do not drive a vehicle, operate machinery, drink alcohol, or participate in activities requiring concentration when taking this medication.  Please use Decadron 2 times daily with food. Use ibuprofen with breakfast, lunch, dinner, and at bedtime.  You may use Tylenol extra strength in between the doses if you needed for additional assistance with your discomfort. Please see Mr. Eston Mould, or return to the emergency department if any worsening of your symptoms, fever, coughing up blood, changes in your condition, problems, or concerns.

## 2018-10-09 NOTE — ED Triage Notes (Signed)
Patient c/o right flank/right rib pain. Per patient was sliding a patient up in bed when she felt a pop and pull in her right side on Tuesday. Patient states no pain Tuesday. Patient states woke up Wednesday morning with vomiting and diarrhea. Patient states took imodium and pain got worse. Patient states now hard to take deep breath due to pain. Patient has hx of pleurisy. Patient called PCP doctor on Thursday, doctor called back on Friday and was given prescription for methylprednisolone 4mg  in which she was told that should help relieve pain if pain is related to pulled muscle or pleurisy. No improvement despite using medication and alternating heat/ice. Denies any vomiting or diarrhea since Thursday.

## 2018-10-09 NOTE — ED Provider Notes (Signed)
[facility] EMERGENCY DEPARTMENT Provider Note   CSN: [id number] Arrival date & time: 10/09/18  1430     History   Chief Complaint No chief complaint on file.   HPI Ruth Dixon is a 48 y.o. female.     Patient is a 48 year old female who presents to the emergency department with a complaint of right flank pain and rib pain.  Patient states that she was positioning a patient in bed when she felt a pop in her right side.  This occurred 4 days ago.  The patient states that she only had minimal pain 4 days ago, but on the following day she woke up with pain, vomiting, and diarrhea.  Patient took some Imodium in the vomiting and diarrhea seem to have improved.  Patient states that now the pain is getting worse and she has pain with taking a deep breathing breath as also with certain movements.  She contacted her primary physician 2 days ago.  The patient was given a prescription for methylprednisone 4 mg, and was told to also use ibuprofen.  The patient says that these have provided only minimal improvement.  Today the pain was severe enough that she had to take 2 of her hydrocodone 10 mg pain tablets.  The patient is not having any more diarrhea or vomiting to be reported.  She feels as though at times she is getting short of breath, however she says that this may be because of the level of pain that she is having.  There is been no hemoptysis reported.  The patient has not noted any fever over the last few days.  No chills reported.  No other injuries reported.  The patient denies being on any anticoagulation medications.  She is not had any recent operations or procedures involving her chest or flank area.     Past Medical History:  Diagnosis Date  . Asthma   . Hypertension   . IBS (irritable bowel syndrome)   . Pleurisy    multiple times  . Reflux     Patient Active Problem List   Diagnosis Date Noted  . Rectal bleeding 01/07/2018  . Abdominal pain 01/06/2018  . Dysphagia  01/06/2018  . Nausea with vomiting 01/06/2018  . GERD (gastroesophageal reflux disease) 01/06/2018    Past Surgical History:  Procedure Laterality Date  . BIOPSY  01/21/2018   Procedure: BIOPSY;  Surgeon: Daneil Dolin, MD;  Location: [facility];  Service: Endoscopy;;  esopahgus/GE junction  . COLONOSCOPY WITH PROPOFOL N/A 01/21/2018   Procedure: COLONOSCOPY WITH PROPOFOL;  Surgeon: Daneil Dolin, MD;  Location: [facility];  Service: Endoscopy;  Laterality: N/A;  12:45pm  . ESOPHAGOGASTRODUODENOSCOPY (EGD) WITH PROPOFOL N/A 01/21/2018   Procedure: ESOPHAGOGASTRODUODENOSCOPY (EGD) WITH PROPOFOL;  Surgeon: Daneil Dolin, MD;  Location: [facility];  Service: Endoscopy;  Laterality: N/A;  . [place] N/A 01/21/2018   Procedure: Venia Minks DILATION;  Surgeon: Daneil Dolin, MD;  Location: [facility];  Service: Endoscopy;  Laterality: N/A;     OB History   No obstetric history on file.      Home Medications    Prior to Admission medications   Medication Sig Start Date End Date Taking? Authorizing Provider  amLODipine (NORVASC) 10 MG tablet Take 10 mg by mouth daily.  04/11/12   [provider]  aspirin EC 81 MG tablet Take 81 mg by mouth daily.    [provider]  ERRIN 0.35 MG tablet TAKE AS DIRECTED BY DOCTOR Patient taking differently: Take 1 tablet by mouth daily.  04/27/15   Florian Buff, MD  fish oil-omega-3 fatty acids 1000 MG capsule Take 2 g by mouth daily.    [provider]  FLUoxetine (PROZAC) 40 MG capsule Take 40 mg by mouth daily.    [provider]  HYDROcodone-acetaminophen (NORCO) 10-325 MG per tablet Take 1-2 tablets by mouth every 6 (six) hours as needed for moderate pain.  04/12/12   [provider]  ibuprofen (ADVIL,MOTRIN) 200 MG tablet Take 400-600 mg by mouth every 6 (six) hours as needed.    [provider]  lansoprazole (PREVACID) 15 MG capsule Take 15 mg by mouth  daily.    [provider]  lansoprazole (PREVACID) 30 MG capsule TAKE 1 CAPSULE BY MOUTH EVERY DAY 07/19/18   Annitta Needs, NP  lisinopril (PRINIVIL,ZESTRIL) 10 MG tablet Take 10 mg by mouth daily.  04/11/12   [provider]  Na Sulfate-K Sulfate-Mg Sulf 17.5-3.13-1.6 GM/177ML SOLN Take 1 kit by mouth as directed. 01/18/18   Rourk, Cristopher Estimable, MD  ondansetron (ZOFRAN ODT) 4 MG disintegrating tablet Take 1 tablet (4 mg total) by mouth every 8 (eight) hours as needed for nausea or vomiting. 05/26/15   Francine Graven, DO  ondansetron (ZOFRAN) 4 MG tablet Take 1 tablet (4 mg total) by mouth 3 (three) times daily before meals. 01/26/18 01/26/19  Annitta Needs, NP  venlafaxine (EFFEXOR) 37.5 MG tablet Take 37.5 mg by mouth 2 (two) times daily.    [provider]    Family History Family History  Problem Relation Age of Onset  . Diabetes Maternal Grandmother   . COPD Maternal Grandmother   . Stroke Maternal Grandmother   . COPD Maternal Grandfather   . COPD Paternal Grandmother   . COPD Paternal Grandfather   . Colon cancer Neg Hx     Social History Social History   Tobacco Use  . Smoking status: Current Every Day Smoker    Packs/day: 1.00    Types: Cigarettes  . Smokeless tobacco: Never Used  Substance Use Topics  . Alcohol use: Yes    Comment: 0.25-0.5 pints a day; previously 1 pint of liquor a day  . Drug use: No     Allergies   Patient has no known allergies.   Review of Systems Review of Systems  Constitutional: Negative for activity change and appetite change.  HENT: Negative for congestion, ear discharge, ear pain, facial swelling, nosebleeds, rhinorrhea, sneezing and tinnitus.   Eyes: Negative for photophobia, pain and discharge.  Respiratory: Positive for chest tightness. Negative for cough, choking, shortness of breath and wheezing.        Chest wall pain.  Cardiovascular: Negative for chest pain, palpitations and leg swelling.   Gastrointestinal: Positive for diarrhea and vomiting. Negative for abdominal pain, blood in stool, constipation and nausea.  Genitourinary: Positive for flank pain. Negative for difficulty urinating, dysuria, frequency and hematuria.  Musculoskeletal: Negative for back pain, gait problem, myalgias and neck pain.  Skin: Negative for color change, rash and wound.  Neurological: Negative for dizziness, seizures, syncope, facial asymmetry, speech difficulty, weakness and numbness.  Hematological: Negative for adenopathy. Does not bruise/bleed easily.  Psychiatric/Behavioral: Negative for agitation, confusion, hallucinations, self-injury and suicidal ideas. The patient is not nervous/anxious.      Physical Exam Updated Vital Signs There were no vitals taken for this visit.  Physical Exam Vitals signs and  nursing note reviewed.  Constitutional:      Appearance: She is well-developed. She is not toxic-appearing.  HENT:     Head: Normocephalic.     Right Ear: Tympanic membrane and external ear normal.     Left Ear: Tympanic membrane and external ear normal.  Eyes:     General: Lids are normal.     Pupils: Pupils are equal, round, and reactive to light.  Neck:     Musculoskeletal: Normal range of motion and neck supple.     Vascular: No carotid bruit.  Cardiovascular:     Rate and Rhythm: Regular rhythm. Tachycardia present.     Pulses: Normal pulses.     Heart sounds: Normal heart sounds.     Comments: Coarse breath sounds present.  Patient speaks in complete sentences without problem.  There is pain with taking a deep breath on the right upper lateral chest.  There is pain to palpation in the same area. Pulmonary:     Effort: No respiratory distress.     Breath sounds: Normal breath sounds.    Chest:    Abdominal:     General: Bowel sounds are normal.     Palpations: Abdomen is soft.     Tenderness: There is no abdominal tenderness. There is no guarding.  Musculoskeletal:  Normal range of motion.  Lymphadenopathy:     Head:     Right side of head: No submandibular adenopathy.     Left side of head: No submandibular adenopathy.     Cervical: No cervical adenopathy.  Skin:    General: Skin is warm and dry.  Neurological:     Mental Status: She is alert and oriented to person, place, and time.     Cranial Nerves: No cranial nerve deficit.     Sensory: No sensory deficit.  Psychiatric:        Speech: Speech normal.      ED Treatments / Results  Labs (all labs ordered are listed, but only abnormal results are displayed) Labs Reviewed - No data to display  EKG None  Radiology No results found.  Procedures Procedures (including critical care time)  Medications Ordered in ED Medications - No data to display   Initial Impression / Assessment and Plan / ED Course  I have reviewed the triage vital signs and the nursing notes.  Pertinent labs & imaging results that were available during my care of the patient were reviewed by me and considered in my medical decision making (see chart for details).          Final Clinical Impressions(s) / ED Diagnoses MDM  Pulse rate is elevated at 101, otherwise vital signs within normal limits.  Pulse oximetry is 94% on room air.  We will monitor this closely.  Recheck.  Patient feeling better after medication for pain, but states she still has pain with taking a deep breath.  Urine analysis is negative for acute changes.  X-ray of the right ribs is negative for fracture or dislocation.  X-ray of the chest is negative for acute event.  D-dimer is negative for pulmonary embolism or acute event.  I discussed with the patient the need to use incentive spirometer several times each hour.  I discussed the medications with her and advised her on using the Flexeril 3 times a day, but using it with caution due to the possibility of it causing drowsiness, and/or lightheadedness.  Also advised the patient to see  her primary physician or return  to the emergency department if any worsening of her conditions, coughing up blood, high fever, changes in her symptoms, problems or concerns.  Patient is in agreement with this plan   Final diagnoses:  Chest wall pain  Muscle strain of chest wall, initial encounter    ED Discharge Orders         Ordered    cyclobenzaprine (FLEXERIL) 10 MG tablet  3 times daily     10/09/18 1706    dexamethasone (DECADRON) 4 MG tablet  2 times daily with meals     10/09/18 1706    ibuprofen (ADVIL) 600 MG tablet  4 times daily     10/09/18 1706           Lily Kocher, PA-C 10/09/18 1954    Daleen Bo, MD 10/09/18 2039

## 2019-01-06 DIAGNOSIS — F17210 Nicotine dependence, cigarettes, uncomplicated: Secondary | ICD-10-CM | POA: Insufficient documentation

## 2019-01-14 DIAGNOSIS — J984 Other disorders of lung: Secondary | ICD-10-CM | POA: Insufficient documentation

## 2019-01-25 ENCOUNTER — Other Ambulatory Visit: Payer: Self-pay

## 2019-01-25 ENCOUNTER — Encounter (HOSPITAL_COMMUNITY): Payer: Self-pay

## 2019-01-25 ENCOUNTER — Emergency Department (HOSPITAL_COMMUNITY)
Admission: EM | Admit: 2019-01-25 | Discharge: 2019-01-25 | Disposition: A | Discharge status: Home / Self Care | Payer: 59 | Attending: Emergency Medicine | Admitting: Emergency Medicine

## 2019-01-25 ENCOUNTER — Emergency Department (HOSPITAL_COMMUNITY): Payer: 59

## 2019-01-25 DIAGNOSIS — Y929 Unspecified place or not applicable: Secondary | ICD-10-CM | POA: Insufficient documentation

## 2019-01-25 DIAGNOSIS — W010XXA Fall on same level from slipping, tripping and stumbling without subsequent striking against object, initial encounter: Secondary | ICD-10-CM | POA: Insufficient documentation

## 2019-01-25 DIAGNOSIS — Y939 Activity, unspecified: Secondary | ICD-10-CM | POA: Diagnosis not present

## 2019-01-25 DIAGNOSIS — Z79899 Other long term (current) drug therapy: Secondary | ICD-10-CM | POA: Insufficient documentation

## 2019-01-25 DIAGNOSIS — S2231XA Fracture of one rib, right side, initial encounter for closed fracture: Secondary | ICD-10-CM | POA: Insufficient documentation

## 2019-01-25 DIAGNOSIS — Y999 Unspecified external cause status: Secondary | ICD-10-CM | POA: Diagnosis not present

## 2019-01-25 DIAGNOSIS — J45909 Unspecified asthma, uncomplicated: Secondary | ICD-10-CM | POA: Insufficient documentation

## 2019-01-25 DIAGNOSIS — I10 Essential (primary) hypertension: Secondary | ICD-10-CM | POA: Insufficient documentation

## 2019-01-25 DIAGNOSIS — F17210 Nicotine dependence, cigarettes, uncomplicated: Secondary | ICD-10-CM | POA: Diagnosis not present

## 2019-01-25 DIAGNOSIS — Z7982 Long term (current) use of aspirin: Secondary | ICD-10-CM | POA: Insufficient documentation

## 2019-01-25 DIAGNOSIS — S299XXA Unspecified injury of thorax, initial encounter: Secondary | ICD-10-CM | POA: Diagnosis present

## 2019-01-25 MED ORDER — OXYCODONE-ACETAMINOPHEN 5-325 MG PO TABS
1.0000 | ORAL_TABLET | Freq: Once | ORAL | Status: AC
  Administered 2019-01-25: 15:00:00 1 via ORAL
  Filled 2019-01-25: qty 1

## 2019-01-25 MED ORDER — LIDOCAINE 5 % EX PTCH: 1.0000 | MEDICATED_PATCH | CUTANEOUS | 0 refills | Status: DC

## 2019-01-25 MED ORDER — ONDANSETRON 4 MG PO TBDP
4.0000 mg | ORAL_TABLET | Freq: Once | ORAL | Status: AC
  Administered 2019-01-25: 15:00:00 4 mg via ORAL
  Filled 2019-01-25: qty 1

## 2019-01-25 MED ORDER — KETOROLAC TROMETHAMINE 30 MG/ML IJ SOLN
30.0000 mg | Freq: Once | INTRAMUSCULAR | Status: AC
  Administered 2019-01-25: 15:00:00 30 mg via INTRAMUSCULAR
  Filled 2019-01-25: qty 1

## 2019-01-25 MED ORDER — LIDOCAINE 5 % EX PTCH
1.0000 | MEDICATED_PATCH | CUTANEOUS | Status: DC
  Administered 2019-01-25: 1 via TRANSDERMAL
  Filled 2019-01-25: qty 1

## 2019-01-25 NOTE — ED Provider Notes (Signed)
[facility] EMERGENCY DEPARTMENT Provider Note   CSN: [id number] Arrival date & time: 01/25/19  1317     History Chief Complaint  Patient presents with  . Chest Pain    Ruth Dixon is a 48 y.o. female with a history of asthma, htn, IBS and history of pleurisy on multiple occasions, generally right sided chest wall since she had a fall several years ago resulting in multiple rib fractures.  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  She denies sob, but endorses increased pain with deep inspiration.  Denies fevers, cough, n/v, also no peripheral edema.  Denies any new injury.   The history is provided by the patient.       Past Medical History:  Diagnosis Date  . Asthma   . Hypertension   . IBS (irritable bowel syndrome)   . Pleurisy    multiple times  . Reflux     Patient Active Problem List   Diagnosis Date Noted  . Rectal bleeding 01/07/2018  . Abdominal pain 01/06/2018  . Dysphagia 01/06/2018  . Nausea with vomiting 01/06/2018  . GERD (gastroesophageal reflux disease) 01/06/2018    Past Surgical History:  Procedure Laterality Date  . BIOPSY  01/21/2018   Procedure: BIOPSY;  Surgeon: Daneil Dolin, MD;  [facility]: [facility];  Service: Endoscopy;;  esopahgus/GE junction  . COLONOSCOPY WITH PROPOFOL N/A 01/21/2018   Procedure: COLONOSCOPY WITH PROPOFOL;  Surgeon: Daneil Dolin, MD;  Location: [facility];  Service: Endoscopy;  Laterality: N/A;  12:45pm  . ESOPHAGOGASTRODUODENOSCOPY (EGD) WITH PROPOFOL N/A 01/21/2018   Procedure: ESOPHAGOGASTRODUODENOSCOPY (EGD) WITH PROPOFOL;  Surgeon: Daneil Dolin, MD;  Location: [facility];  Service: Endoscopy;  Laterality: N/A;  . Reid Hope King N/A 01/21/2018   Procedure: Venia Minks DILATION;  Surgeon: Daneil Dolin,  MD;  Location: [facility];  Service: Endoscopy;  Laterality: N/A;     OB History   No obstetric history on file.     Family History  Problem Relation Age of Onset  . Diabetes Maternal Grandmother   . COPD Maternal Grandmother   . Stroke Maternal Grandmother   . COPD Maternal Grandfather   . COPD Paternal Grandmother   . COPD Paternal Grandfather   . Colon cancer Neg Hx     Social History   Tobacco Use  . Smoking status: Current Every Day Smoker    Packs/day: 1.00    Types: Cigarettes  . Smokeless tobacco: Never Used  Substance Use Topics  . Alcohol use: Yes    Comment: 2 beers every night  . Drug use: No    Home Medications Prior to Admission medications   Medication Sig Start Date End Date Taking? Authorizing Provider  amLODipine (NORVASC) 10 MG tablet Take 10 mg by mouth daily.  04/11/12   [provider]  aspirin EC 81 MG tablet Take 81 mg by mouth daily.    [provider]  cyclobenzaprine (FLEXERIL) 10 MG tablet Take 1 tablet (10 mg total) by mouth 3 (three) times daily. 10/09/18   Lily Kocher, PA-C  dexamethasone (DECADRON) 4 MG tablet Take 1 tablet (4 mg total) by mouth 2 (two) times daily with a meal. 10/09/18   Lily Kocher, PA-C  ERRIN 0.35 MG tablet TAKE AS DIRECTED BY DOCTOR Patient taking differently:  Take 1 tablet by mouth daily.  04/27/15   Florian Buff, MD  FLUoxetine (PROZAC) 40 MG capsule Take 40 mg by mouth daily.    [provider]  furosemide (LASIX) 20 MG tablet Take 40 mg by mouth daily.    [provider]  HYDROcodone-acetaminophen (NORCO) 10-325 MG per tablet Take 1-2 tablets by mouth every 6 (six) hours as needed for moderate pain.  04/12/12   [provider]  ibuprofen (ADVIL) 600 MG tablet Take 1 tablet (600 mg total) by mouth 4 (four) times daily. 10/09/18   Lily Kocher, PA-C  lansoprazole (PREVACID) 15 MG capsule Take 15 mg by mouth daily.    [provider]  lansoprazole (PREVACID) 30  MG capsule TAKE 1 CAPSULE BY MOUTH EVERY DAY 07/19/18   Annitta Needs, NP  lidocaine (LIDODERM) 5 % Place 1 patch onto the skin daily. Remove & Discard patch within 12 hours or as directed by MD 01/25/19   Evalee Jefferson, PA-C  lisinopril (PRINIVIL,ZESTRIL) 10 MG tablet Take 10 mg by mouth daily.  04/11/12   [provider]  Na Sulfate-K Sulfate-Mg Sulf 17.5-3.13-1.6 GM/177ML SOLN Take 1 kit by mouth as directed. 01/18/18   Rourk, Cristopher Estimable, MD  ondansetron (ZOFRAN ODT) 4 MG disintegrating tablet Take 1 tablet (4 mg total) by mouth every 8 (eight) hours as needed for nausea or vomiting. 05/26/15   Francine Graven, DO  ondansetron (ZOFRAN) 4 MG tablet Take 1 tablet (4 mg total) by mouth 3 (three) times daily before meals. 01/26/18 01/26/19  Annitta Needs, NP  venlafaxine (EFFEXOR) 37.5 MG tablet Take 37.5 mg by mouth 2 (two) times daily.    [provider]    Allergies    Patient has no known allergies.  Review of Systems   Review of Systems  Constitutional: Negative for chills and fever.  HENT: Negative for congestion and sore throat.   Eyes: Negative.   Respiratory: Negative for cough, chest tightness and shortness of breath.   Cardiovascular: Positive for chest pain. Negative for palpitations and leg swelling.  Gastrointestinal: Negative for abdominal pain, nausea and vomiting.  Genitourinary: Negative.   Musculoskeletal: Negative for arthralgias, joint swelling and neck pain.  Skin: Negative.  Negative for rash and wound.  Neurological: Negative for dizziness, weakness, light-headedness, numbness and headaches.  Psychiatric/Behavioral: Negative.     Physical Exam Updated Vital Signs BP 122/72 (BP Location: Right Arm)   Pulse (!) 112   Temp 98.2 F (36.8 C) (Oral)   Resp 16   Ht 5' 7" (1.702 m)   Wt 93 kg   SpO2 96%   BMI 32.11 kg/m   Physical Exam Vitals and nursing note reviewed.  Constitutional:      General: She is in acute distress.     Appearance: She  is well-developed.     Comments: Anxious, acute pain  HENT:     Head: Normocephalic and atraumatic.  Eyes:     Conjunctiva/sclera: Conjunctivae normal.  Cardiovascular:     Rate and Rhythm: Regular rhythm. Tachycardia present.     Heart sounds: Normal heart sounds.  Pulmonary:     Effort: Pulmonary effort is normal. No tachypnea or respiratory distress.     Breath sounds: Normal breath sounds. No wheezing or rhonchi.  Chest:       Comments: Focal pain right mid chest reproducible.  No palpable deformity or hematoma.  Abdominal:     General: Bowel sounds are normal.  Palpations: Abdomen is soft.     Tenderness: There is no abdominal tenderness.  Musculoskeletal:        General: Normal range of motion.     Cervical back: Normal range of motion.  Skin:    General: Skin is warm and dry.  Neurological:     Mental Status: She is alert.     ED Results / Procedures / Treatments   Labs (all labs ordered are listed, but only abnormal results are displayed) Labs Reviewed - No data to display  EKG None  Radiology DG Ribs Unilateral W/Chest Right  Result Date: 01/25/2019 CLINICAL DATA:  Anterior chest wall pain with inspiration. This began a few days ago. EXAM: RIGHT RIBS AND CHEST - 3+ VIEW COMPARISON:  10/09/2018 FINDINGS: There is a fracture of the lateral right eighth rib, slightly displaced just under 1 shaft width, with subtle adjacent calcification consistent with callus formation. Old healed fracture of the adjacent lateral ninth rib. Old healed fractures are noted on the left of the seventh and eighth ribs. No other convincing fractures.  No bone lesions. Cardiac silhouette is normal in size. No mediastinal or hilar masses. No convincing adenopathy. Lungs are clear.  No pleural effusion or pneumothorax. IMPRESSION: 1. Fracture of the lateral right eighth rib with adjacent callus formation. This is either subacute or chronic. No convincing acute fracture. No bone lesion. 2.  There are chronic fractures of the right lateral ninth rib and the left lateral seventh and eighth ribs. 3. No acute cardiopulmonary disease. Electronically Signed   By: Lajean Manes M.D.   On: 01/25/2019 14:36    Procedures Procedures (including critical care time)  Medications Ordered in ED Medications  ketorolac (TORADOL) 30 MG/ML injection 30 mg (30 mg Intramuscular Given 01/25/19 1512)  oxyCODONE-acetaminophen (PERCOCET/ROXICET) 5-325 MG per tablet 1 tablet (1 tablet Oral Given 01/25/19 1511)  ondansetron (ZOFRAN-ODT) disintegrating tablet 4 mg (4 mg Oral Given 01/25/19 1511)    ED Course  I have reviewed the triage vital signs and the nursing notes.  Pertinent labs & imaging results that were available during my care of the patient were reviewed by me and considered in my medical decision making (see chart for details).    MDM Rules/Calculators/A&P                      Pt with localizing pain to the site corresponding with findings on cxr - favor subacute rib fracture but without new trauma per pt report.  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  She was prescribed lidoderm patch.  She has chronic home pain medications. Plan f/u with pcp recheck 1 week, sooner for any worsened sx.  She does have an incentive spirometer for home use.    Final Clinical Impression(s) / ED Diagnoses Final diagnoses:  Closed fracture of one rib of right side, initial encounter    Rx / DC Orders ED Discharge Orders         Ordered    lidocaine (LIDODERM) 5 %  Every 24 hours     01/25/19 1511           Evalee Jefferson, PA-C 01/26/19 1548    Long, Wonda Olds, MD 01/27/19 1351

## 2019-01-25 NOTE — ED Triage Notes (Signed)
Pt is having right rib pain that started a few days ago. Went to work today and started having extreme pain. Pt denies injury. Feels like a pulled muscle she states.

## 2019-01-25 NOTE — Discharge Instructions (Signed)
Continue taking your home pain medication.  Apply the lidoderm patch daily at the site of your pain.  Use your incentive spirometer as discussed.  See your MD for a recheck in one week. Get rechecked if you develop any worsened shortness of breath or fevers.

## 2019-04-25 DIAGNOSIS — M65341 Trigger finger, right ring finger: Secondary | ICD-10-CM | POA: Insufficient documentation

## 2020-01-04 ENCOUNTER — Other Ambulatory Visit: Payer: Self-pay

## 2020-01-04 ENCOUNTER — Ambulatory Visit
Admission: EM | Admit: 2020-01-04 | Discharge: 2020-01-04 | Disposition: A | Discharge status: Home / Self Care | Payer: No Typology Code available for payment source | Attending: Emergency Medicine | Admitting: Emergency Medicine

## 2020-01-04 DIAGNOSIS — Z1152 Encounter for screening for COVID-19: Secondary | ICD-10-CM

## 2020-01-04 NOTE — ED Triage Notes (Signed)
Needs covid test for work 

## 2020-01-05 ENCOUNTER — Other Ambulatory Visit: Payer: 59

## 2020-01-06 LAB — SARS-COV-2, NAA 2 DAY TAT

## 2020-01-06 LAB — NOVEL CORONAVIRUS, NAA: SARS-CoV-2, NAA: NOT DETECTED

## 2020-11-12 ENCOUNTER — Other Ambulatory Visit: Payer: Self-pay

## 2020-11-12 ENCOUNTER — Ambulatory Visit
Admission: EM | Admit: 2020-11-12 | Discharge: 2020-11-12 | Disposition: A | Discharge status: Home / Self Care | Payer: No Typology Code available for payment source

## 2020-11-12 DIAGNOSIS — B9789 Other viral agents as the cause of diseases classified elsewhere: Secondary | ICD-10-CM | POA: Diagnosis not present

## 2020-11-12 DIAGNOSIS — F17200 Nicotine dependence, unspecified, uncomplicated: Secondary | ICD-10-CM

## 2020-11-12 DIAGNOSIS — R051 Acute cough: Secondary | ICD-10-CM

## 2020-11-12 DIAGNOSIS — R062 Wheezing: Secondary | ICD-10-CM

## 2020-11-12 DIAGNOSIS — J988 Other specified respiratory disorders: Secondary | ICD-10-CM

## 2020-11-12 DIAGNOSIS — J4530 Mild persistent asthma, uncomplicated: Secondary | ICD-10-CM

## 2020-11-12 MED ORDER — METHYLPREDNISOLONE SODIUM SUCC 125 MG IJ SOLR
125.0000 mg | Freq: Once | INTRAMUSCULAR | Status: AC
  Administered 2020-11-12: 125 mg via INTRAMUSCULAR

## 2020-11-12 MED ORDER — METHYLPREDNISOLONE ACETATE 80 MG/ML IJ SUSP: 80.0000 mg | Freq: Once | INTRAMUSCULAR | Status: DC

## 2020-11-12 MED ORDER — AZITHROMYCIN 250 MG PO TABS: ORAL_TABLET | ORAL | 0 refills | Status: DC

## 2020-11-12 MED ORDER — PREDNISONE 20 MG PO TABS: ORAL_TABLET | ORAL | 0 refills | Status: DC

## 2020-11-12 MED ORDER — PROMETHAZINE-DM 6.25-15 MG/5ML PO SYRP: 5.0000 mL | ORAL_SOLUTION | Freq: Every evening | ORAL | 0 refills | Status: DC | PRN

## 2020-11-12 MED ORDER — BENZONATATE 100 MG PO CAPS: 100.0000 mg | ORAL_CAPSULE | Freq: Three times a day (TID) | ORAL | 0 refills | Status: DC | PRN

## 2020-11-12 NOTE — ED Triage Notes (Signed)
Pt presents with SOB worsening x 3 days.  Has used inhaler and neb at home with no improvement.  Taking oral Prednisone 4 days ago but not helping.

## 2020-11-12 NOTE — ED Provider Notes (Addendum)
[place]   MRN: [id number] DOB: [birth year]  Subjective:   Ruth Dixon is a 50 y.o. female presenting for 3-day history of acute onset coughing, chest congestion, wheezing, difficulty with her breathing.  Patient has had COVID-19 tests in the past week and were negative.  She is not opposed to having one today.  She has a history of asthma, smokes 1 pack/day.  No history of COPD.  Has been using her albuterol inhaler in the past couple of days.  No current facility-administered medications for this encounter.  Current Outpatient Medications:    busPIRone (BUSPAR) 10 MG tablet, Take 10 mg by mouth 3 (three) times daily., Disp: , Rfl:    furosemide (LASIX) 20 MG tablet, Take 40 mg by mouth daily., Disp: , Rfl:    HYDROcodone-acetaminophen (NORCO) 10-325 MG per tablet, Take 1-2 tablets by mouth every 6 (six) hours as needed for moderate pain. , Disp: , Rfl:    predniSONE (DELTASONE) 10 MG tablet, Take 10 mg by mouth daily with breakfast., Disp: , Rfl:    amLODipine (NORVASC) 10 MG tablet, Take 10 mg by mouth daily. , Disp: , Rfl:    aspirin EC 81 MG tablet, Take 81 mg by mouth daily., Disp: , Rfl:    cyclobenzaprine (FLEXERIL) 10 MG tablet, Take 1 tablet (10 mg total) by mouth 3 (three) times daily., Disp: 20 tablet, Rfl: 0   dexamethasone (DECADRON) 4 MG tablet, Take 1 tablet (4 mg total) by mouth 2 (two) times daily with a meal., Disp: 10 tablet, Rfl: 0   ERRIN 0.35 MG tablet, TAKE AS DIRECTED BY DOCTOR (Patient taking differently: Take 1 tablet by mouth daily. ), Disp: 28 tablet, Rfl: 12   FLUoxetine (PROZAC) 40 MG capsule, Take 40 mg by mouth daily., Disp: , Rfl:    ibuprofen (ADVIL) 600 MG tablet, Take 1 tablet (600 mg total) by mouth 4 (four) times daily., Disp: 30 tablet, Rfl: 0   lansoprazole (PREVACID) 15 MG capsule, Take 15 mg by mouth daily., Disp: , Rfl:    lansoprazole (PREVACID) 30 MG capsule, TAKE 1 CAPSULE BY MOUTH EVERY DAY, Disp: 90 capsule, Rfl: 3    lidocaine (LIDODERM) 5 %, Place 1 patch onto the skin daily. Remove & Discard patch within 12 hours or as directed by MD, Disp: 30 patch, Rfl: 0   lisinopril (PRINIVIL,ZESTRIL) 10 MG tablet, Take 10 mg by mouth daily. , Disp: , Rfl:    Na Sulfate-K Sulfate-Mg Sulf 17.5-3.13-1.6 GM/177ML SOLN, Take 1 kit by mouth as directed., Disp: 1 Bottle, Rfl: 0   ondansetron (ZOFRAN ODT) 4 MG disintegrating tablet, Take 1 tablet (4 mg total) by mouth every 8 (eight) hours as needed for nausea or vomiting., Disp: 6 tablet, Rfl: 0   venlafaxine (EFFEXOR) 37.5 MG tablet, Take 37.5 mg by mouth 2 (two) times daily., Disp: , Rfl:    No Known Allergies  Past Medical History:  Diagnosis Date   Asthma    Hypertension    IBS (irritable bowel syndrome)    Pleurisy    multiple times   Reflux      Past Surgical History:  Procedure Laterality Date   BIOPSY  01/21/2018   Procedure: BIOPSY;  Surgeon: Daneil Dolin, MD;  [facility]: [facility];  Service: Endoscopy;;  esopahgus/GE junction   COLONOSCOPY WITH PROPOFOL N/A 01/21/2018   Procedure: COLONOSCOPY WITH PROPOFOL;  Surgeon: Daneil Dolin, MD;  [facility];  Service: Endoscopy;  Laterality: N/A;  12:45pm   ESOPHAGOGASTRODUODENOSCOPY (EGD) WITH PROPOFOL N/A 01/21/2018   Procedure: ESOPHAGOGASTRODUODENOSCOPY (EGD) WITH PROPOFOL;  Surgeon: Daneil Dolin, MD;  [facility];  Service: Endoscopy;  Laterality: N/A;   HERNIA REPAIR     MALONEY DILATION N/A 01/21/2018   Procedure: Venia Minks DILATION;  Surgeon: Daneil Dolin, MD;  [facility];  Service: Endoscopy;  Laterality: N/A;    Family History  Problem Relation Age of Onset   Diabetes Maternal Grandmother    COPD Maternal Grandmother    Stroke Maternal Grandmother    COPD Maternal Grandfather    COPD Paternal Grandmother    COPD Paternal Grandfather    Colon cancer Neg Hx     Social History   Tobacco Use   Smoking status: Every Day    Packs/day: 1.00     Types: Cigarettes   Smokeless tobacco: Never  Vaping Use   Vaping Use: Never used  Substance Use Topics   Alcohol use: Yes    Comment: 2 beers every night   Drug use: No    ROS   Objective:   Vitals: BP (!) 146/97 (BP Location: Right Arm)   Pulse 90   Temp (!) 97.4 F (36.3 C) (Tympanic)   Resp 16   SpO2 97%   Physical Exam Constitutional:      General: She is not in acute distress.    Appearance: Normal appearance. She is well-developed. She is not ill-appearing, toxic-appearing or diaphoretic.  HENT:     Head: Normocephalic and atraumatic.     Right Ear: Tympanic membrane, ear canal and external ear normal. No drainage or tenderness. No middle ear effusion. Tympanic membrane is not erythematous.     Left Ear: Tympanic membrane, ear canal and external ear normal. No drainage or tenderness.  No middle ear effusion. Tympanic membrane is not erythematous.     Nose: Nose normal. No congestion or rhinorrhea.     Mouth/Throat:     Mouth: Mucous membranes are moist. No oral lesions.     Pharynx: No pharyngeal swelling, oropharyngeal exudate, posterior oropharyngeal erythema or uvula swelling.     Tonsils: No tonsillar exudate or tonsillar abscesses.  Eyes:     General: No scleral icterus.       Right eye: No discharge.        Left eye: No discharge.     Extraocular Movements: Extraocular movements intact.     Right eye: Normal extraocular motion.     Left eye: Normal extraocular motion.     Conjunctiva/sclera: Conjunctivae normal.     Pupils: Pupils are equal, round, and reactive to light.  Cardiovascular:     Rate and Rhythm: Normal rate and regular rhythm.     Pulses: Normal pulses.     Heart sounds: Normal heart sounds. No murmur heard.   No friction rub. No gallop.  Pulmonary:     Effort: Pulmonary effort is normal. No respiratory distress.     Breath sounds: No stridor. Examination of the right-upper field reveals wheezing. Examination of the left-upper field  reveals wheezing. Examination of the right-middle field reveals wheezing and rhonchi. Examination of the left-middle field reveals wheezing and rhonchi. Examination of the right-lower field reveals wheezing. Examination of the left-lower field reveals wheezing. Wheezing and rhonchi present. No decreased breath sounds or rales.  Musculoskeletal:     Cervical back: Normal range of motion and neck supple.  Lymphadenopathy:     Cervical: No cervical adenopathy.  Skin:  General: Skin is warm and dry.     Findings: No rash.  Neurological:     General: No focal deficit present.     Mental Status: She is alert and oriented to person, place, and time.  Psychiatric:        Mood and Affect: Mood normal.        Behavior: Behavior normal.        Thought Content: Thought content normal.        Judgment: Judgment normal.    Assessment and Plan :   PDMP not reviewed this encounter.  1. Viral respiratory illness   2. Acute cough   3. Wheezing   4. Mild persistent asthma without complication   5. Smoker     COVID-19 testing pending.  In light of her asthma, history of smoking recommended continued use of the albuterol inhaler but we will also use IM Depo-Medrol in clinic.  Recommended supportive care otherwise.  Provided her with a prescription for azithromycin in the event that she has no improvement in the next couple of days advised that she start taking this.  If she continues to have issues recommended presenting back here to the hospital if her symptoms are severe. Counseled patient on potential for adverse effects with medications prescribed/recommended today, ER and return-to-clinic precautions discussed, patient verbalized understanding.    Jaynee Eagles, PA-C 11/12/20 1127   We did not have Depomedrol in stock. Will use Solumedrol and follow with an oral steroid course.    Jaynee Eagles, PA-C 11/12/20 1132

## 2020-11-12 NOTE — Discharge Instructions (Addendum)
We will notify you of your COVID-19 test results as they arrive and may take between 48-72 hours.  I encourage you to sign up for MyChart if you have not already done so as this can be the easiest way for [place] to communicate results to you online or through a phone app.  Generally, we only contact you if it is a positive COVID result.  In the meantime, if you develop worsening symptoms including fever, chest pain, shortness of breath despite our current treatment plan then please report to the emergency room as this may be a sign of worsening status from possible COVID-19 infection.  Otherwise, we will manage this as a viral syndrome. For sore throat or cough try using a honey-based tea. Use 3 teaspoons of honey with juice squeezed from half lemon. Place shaved pieces of ginger into 1/2-1 cup of water and warm over stove top. Then mix the ingredients and repeat every 4 hours as needed. Please take Tylenol 500mg -650mg  every 6 hours for aches and pains, fevers. Hydrate very well with at least 2 liters of water. Eat light meals such as soups to replenish electrolytes and soft fruits, veggies. Start an antihistamine like Zyrtec for postnasal drainage, sinus congestion.   I sent a prescription for azithromycin, an antibiotic that helps with respiratory infections. Do not take this yet. Wait for 2 days and if you still have a hard time then go ahead and start it. Otherwise, if you improve with the steroid injection, then don't take it.

## 2020-11-13 LAB — NOVEL CORONAVIRUS, NAA: SARS-CoV-2, NAA: NOT DETECTED

## 2020-11-13 LAB — SARS-COV-2, NAA 2 DAY TAT

## 2020-11-15 ENCOUNTER — Encounter (HOSPITAL_COMMUNITY): Payer: Self-pay | Admitting: *Deleted

## 2020-11-15 ENCOUNTER — Ambulatory Visit
Admission: EM | Admit: 2020-11-15 | Discharge: 2020-11-15 | Disposition: A | Discharge status: Home / Self Care | Payer: No Typology Code available for payment source

## 2020-11-15 ENCOUNTER — Emergency Department (HOSPITAL_COMMUNITY): Payer: No Typology Code available for payment source

## 2020-11-15 ENCOUNTER — Emergency Department (HOSPITAL_COMMUNITY)
Admission: EM | Admit: 2020-11-15 | Discharge: 2020-11-15 | Disposition: A | Discharge status: Home / Self Care | Payer: No Typology Code available for payment source | Attending: Emergency Medicine | Admitting: Emergency Medicine

## 2020-11-15 ENCOUNTER — Other Ambulatory Visit: Payer: Self-pay

## 2020-11-15 DIAGNOSIS — J9601 Acute respiratory failure with hypoxia: Secondary | ICD-10-CM

## 2020-11-15 DIAGNOSIS — R000 Tachycardia, unspecified: Secondary | ICD-10-CM | POA: Diagnosis not present

## 2020-11-15 DIAGNOSIS — R0602 Shortness of breath: Secondary | ICD-10-CM | POA: Insufficient documentation

## 2020-11-15 DIAGNOSIS — I10 Essential (primary) hypertension: Secondary | ICD-10-CM | POA: Diagnosis not present

## 2020-11-15 DIAGNOSIS — F17210 Nicotine dependence, cigarettes, uncomplicated: Secondary | ICD-10-CM | POA: Diagnosis not present

## 2020-11-15 DIAGNOSIS — R062 Wheezing: Secondary | ICD-10-CM | POA: Diagnosis not present

## 2020-11-15 DIAGNOSIS — J45909 Unspecified asthma, uncomplicated: Secondary | ICD-10-CM | POA: Diagnosis not present

## 2020-11-15 MED ORDER — DEXAMETHASONE SODIUM PHOSPHATE 10 MG/ML IJ SOLN
10.0000 mg | Freq: Once | INTRAMUSCULAR | Status: AC
  Administered 2020-11-15: 10 mg via INTRAMUSCULAR
  Filled 2020-11-15: qty 1

## 2020-11-15 MED ORDER — METHYLPREDNISOLONE SODIUM SUCC 125 MG IJ SOLR: 125.0000 mg | Freq: Once | INTRAMUSCULAR | Status: DC

## 2020-11-15 MED ORDER — ALBUTEROL SULFATE HFA 108 (90 BASE) MCG/ACT IN AERS: 2.0000 | INHALATION_SPRAY | RESPIRATORY_TRACT | Status: DC | PRN

## 2020-11-15 MED ORDER — ALBUTEROL SULFATE HFA 108 (90 BASE) MCG/ACT IN AERS
2.0000 | INHALATION_SPRAY | Freq: Once | RESPIRATORY_TRACT | Status: AC
  Administered 2020-11-15: 2 via RESPIRATORY_TRACT
  Filled 2020-11-15: qty 6.7

## 2020-11-15 MED ORDER — MAGNESIUM SULFATE 2 GM/50ML IV SOLN: 2.0000 g | Freq: Once | INTRAVENOUS | Status: DC

## 2020-11-15 MED ORDER — ALBUTEROL SULFATE HFA 108 (90 BASE) MCG/ACT IN AERS: 1.0000 | INHALATION_SPRAY | Freq: Four times a day (QID) | RESPIRATORY_TRACT | 0 refills | Status: DC | PRN

## 2020-11-15 MED ORDER — ALBUTEROL SULFATE (2.5 MG/3ML) 0.083% IN NEBU
5.0000 mg | INHALATION_SOLUTION | Freq: Once | RESPIRATORY_TRACT | Status: AC
  Administered 2020-11-15: 5 mg via RESPIRATORY_TRACT
  Filled 2020-11-15: qty 6

## 2020-11-15 MED ORDER — IPRATROPIUM BROMIDE 0.02 % IN SOLN
0.5000 mg | Freq: Once | RESPIRATORY_TRACT | Status: AC
  Administered 2020-11-15: 0.5 mg via RESPIRATORY_TRACT
  Filled 2020-11-15: qty 2.5

## 2020-11-15 MED ORDER — PREDNISONE 10 MG (21) PO TBPK: ORAL_TABLET | Freq: Every day | ORAL | 0 refills | Status: DC

## 2020-11-15 NOTE — ED Provider Notes (Signed)
Emergency Medicine Provider Triage Evaluation Note  Ruth Dixon , a 50 y.o. female  was evaluated in triage.  Pt complains of cough and sob.  Review of Systems  Positive: Cough, sob Negative: cp  Physical Exam  BP (!) 147/130 (BP Location: Right Arm)   Temp 97.6 F (36.4 C)   Resp (!) 24   Ht 5\' 7"  (1.702 m)   Wt 72.1 kg   SpO2 93%   BMI 24.90 kg/m  Gen:   Awake, no distress   Resp:  Normal effort  MSK:   Moves extremities without difficulty  Other:  Diffuse wheezing, no leg swelling  Medical Decision Making  Medically screening exam initiated at 4:35 PM.  Appropriate orders placed.  Ruth Dixon was informed that the remainder of the evaluation will be completed by another provider, this initial triage assessment does not replace that evaluation, and the importance of remaining in the ED until their evaluation is complete.     Ruth Dixon 11/15/20 1635    11/17/20, MD 11/21/20 [phone]

## 2020-11-15 NOTE — ED Notes (Signed)
Patient is being discharged from the Urgent Care and sent to the Emergency Department via POV, pt refused EMS . Per Roosvelt Maser, patient is in need of higher level of care due to respiratory failure. Patient is aware and verbalizes understanding of plan of care.  Vitals:   11/15/20 1325  BP: 128/66  Pulse: (!) 101  Resp: 19  Temp: 98 F (36.7 C)  SpO2: (!) 88%

## 2020-11-15 NOTE — ED Provider Notes (Signed)
Emergency Department Provider Note   I have reviewed the triage vital signs and the nursing notes.   HISTORY  Chief Complaint Shortness of Breath   HPI Ruth Dixon is a 50 y.o. female with past medical history of asthma/COPD and hypertension presents to the emergency department with cough and shortness of breath.  She has been on outpatient medication for a COPD exacerbation.  She has been given Decadron along with albuterol at home, cough medication, antibiotics.  She has been feeling better and wanted to follow with her PCP today for refill of her inhaler.  She states sometimes she feels improved after receiving a second shot of steroid which her PCP typically does for her.  She states today she went to her PCP office but he was out and so she went to urgent care.  There she had report of low oxygen saturation.  She tells me that they wanted to transfer her to the emergency department by EMS but she ultimately convinced him to come here on her own.  She states that she is feeling overall well compared to Monday but continued to have some lingering symptoms which prompted her to seek care at her PCP office.  He is not having chest pain, tightness, pleurisy.  No fevers or chills.  No GI symptoms.   Past Medical History:  Diagnosis Date   Asthma    Hypertension    IBS (irritable bowel syndrome)    Pleurisy    multiple times   Reflux     Patient Active Problem List   Diagnosis Date Noted   Rectal bleeding 01/07/2018   Abdominal pain 01/06/2018   Dysphagia 01/06/2018   Nausea with vomiting 01/06/2018   GERD (gastroesophageal reflux disease) 01/06/2018    Past Surgical History:  Procedure Laterality Date   BIOPSY  01/21/2018   Procedure: BIOPSY;  Surgeon: Corbin Ade, MD;  [facility];  Service: Endoscopy;;  esopahgus/GE junction   COLONOSCOPY WITH PROPOFOL N/A 01/21/2018   Procedure: COLONOSCOPY WITH PROPOFOL;  Surgeon: Corbin Ade, MD;  [facility];  Service: Endoscopy;  Laterality: N/A;  12:45pm   ESOPHAGOGASTRODUODENOSCOPY (EGD) WITH PROPOFOL N/A 01/21/2018   Procedure: ESOPHAGOGASTRODUODENOSCOPY (EGD) WITH PROPOFOL;  Surgeon: Corbin Ade, MD;  [facility];  Service: Endoscopy;  Laterality: N/A;   HERNIA REPAIR     MALONEY DILATION N/A 01/21/2018   Procedure: Elease Hashimoto DILATION;  Surgeon: Corbin Ade, MD;  Location: [facility];  Service: Endoscopy;  Laterality: N/A;    Allergies Patient has no known allergies.  Family History  Problem Relation Age of Onset   Congestive Heart Failure Mother    Diabetes Maternal Grandmother    COPD Maternal Grandmother    Stroke Maternal Grandmother    COPD Maternal Grandfather    COPD Paternal Grandmother    COPD Paternal Grandfather    Colon cancer Neg Hx     Social History Social History   Tobacco Use   Smoking status: Every Day    Packs/day: 1.00    Types: Cigarettes   Smokeless tobacco: Never  Vaping Use   Vaping Use: Never used  Substance Use Topics   Alcohol use: Not Currently    Comment: 2 beers every night   Drug use: No    Review of Systems  Constitutional: No fever/chills Eyes: No visual changes. ENT: No sore throat. Cardiovascular: Denies chest pain. Respiratory: Positive shortness of breath and cough.  Gastrointestinal: No abdominal  pain.  No nausea, no vomiting.  No diarrhea.  No constipation. Genitourinary: Negative for dysuria. Musculoskeletal: Negative for back pain. Skin: Negative for rash. Neurological: Negative for headaches, focal weakness or numbness.  10-point ROS otherwise negative.  ____________________________________________   PHYSICAL EXAM:  VITAL SIGNS: ED Triage Vitals  Enc Vitals Group     BP 11/15/20 1607 (!) 147/130     Pulse Rate 11/15/20 1850 94     Resp 11/15/20 1607 (!) 24     Temp 11/15/20 1607 97.6 F (36.4 C)     Temp Source 11/15/20 1850 Oral     SpO2 11/15/20 1607 93 %     Weight  11/15/20 1607 159 lb (72.1 kg)     Height 11/15/20 1607 5\' 7"  (1.702 m)   Constitutional: Alert and oriented. Well appearing and in no acute distress. Speaking in full sentences without distress.  Eyes: Conjunctivae are normal.  Head: Atraumatic. Nose: No congestion/rhinnorhea. Mouth/Throat: Mucous membranes are moist.   Neck: No stridor.   Cardiovascular: Normal rate, regular rhythm. Good peripheral circulation. Grossly normal heart sounds.   Respiratory: Normal respiratory effort.  No retractions. Lungs with mild end expiratory wheezing bilaterally.  Gastrointestinal: Soft and nontender. No distention.  Musculoskeletal: No gross deformities of extremities. Neurologic:  Normal speech and language. No gross focal neurologic deficits are appreciated.  Skin:  Skin is warm, dry and intact. No rash noted.  ____________________________________________  RADIOLOGY  DG Chest 2 View  Result Date: 11/15/2020 CLINICAL DATA:  Shortness of breath EXAM: CHEST - 2 VIEW COMPARISON:  Chest x-ray 01/25/2019, CT chest 12/19/2014 FINDINGS: The heart and mediastinal contours are within normal limits. Bibasilar atelectasis. No focal consolidation. No pulmonary edema. No pleural effusion. No pneumothorax. No acute osseous abnormality. IMPRESSION: No active cardiopulmonary disease. Electronically Signed   By: 12/21/2014 M.D.   On: 11/15/2020 17:40    ____________________________________________   PROCEDURES  Procedure(s) performed:   Procedures  None  ____________________________________________   INITIAL IMPRESSION / ASSESSMENT AND PLAN / ED COURSE  Pertinent labs & imaging results that were available during my care of the patient were reviewed by me and considered in my medical decision making (see chart for details).   Patient presents emergency department with continued mild symptoms but overall improved after outpatient therapy started on Monday.  She has normal oxygen saturation here on  room air without respiratory distress.  She speaking in full sentences.  She does have some mild wheezing but states that had her PCP been in the office she would have received a second steroid shot and an albuterol inhaler refill and felt well enough to go home.  No findings on exam or history to strongly suspect ACS or PE.  Chest x-ray obtained during the [facility] process and is clear by my interpretation.  Do not consider this a failure of outpatient therapy at this time.  Plan for additional Decadron but will send her home with a steroid taper given multiple doses this week.  She will continue antibiotics.  Will provide inhaler here at time of discharge.    ____________________________________________  FINAL CLINICAL IMPRESSION(S) / ED DIAGNOSES  Final diagnoses:  Shortness of breath     MEDICATIONS GIVEN DURING THIS VISIT:  Medications  albuterol (VENTOLIN HFA) 108 (90 Base) MCG/ACT inhaler 2 puff (has no administration in time range)  albuterol (PROVENTIL) (2.5 MG/3ML) 0.083% nebulizer solution 5 mg (has no administration in time range)  ipratropium (ATROVENT) nebulizer solution 0.5 mg (has no administration in  time range)  dexamethasone (DECADRON) injection 10 mg (has no administration in time range)  albuterol (VENTOLIN HFA) 108 (90 Base) MCG/ACT inhaler 2 puff (2 puffs Inhalation Given 11/15/20 2042)     NEW OUTPATIENT MEDICATIONS STARTED DURING THIS VISIT:  New Prescriptions   ALBUTEROL (VENTOLIN HFA) 108 (90 BASE) MCG/ACT INHALER    Inhale 1-2 puffs into the lungs every 6 (six) hours as needed for wheezing or shortness of breath.   PREDNISONE (STERAPRED UNI-PAK 21 TAB) 10 MG (21) TBPK TABLET    Take by mouth daily. Take 6 tabs by mouth daily  for 2 days, then 5 tabs for 2 days, then 4 tabs for 2 days, then 3 tabs for 2 days, 2 tabs for 2 days, then 1 tab by mouth daily for 2 days    Note:  This document was prepared using Dragon voice recognition software and may include  unintentional dictation errors.  Alona Bene, MD, [facility] Emergency Medicine    Marykatherine Sherwood, Arlyss Repress, MD 11/15/20 2043

## 2020-11-15 NOTE — Discharge Instructions (Signed)
You were seen in the emergency room today with wheezing and some mild shortness of breath.  We are giving you additional steroid and we will send you home with a steroid Dosepak to taper off of steroids this week.  Please continue your home medications along with breathing treatments.  If you develop any new or suddenly worsening symptoms please return to the emergency department for reevaluation.

## 2020-11-15 NOTE — ED Triage Notes (Signed)
Referred from urgent care for evaluation of shortness of breath, history of COPD

## 2020-11-15 NOTE — ED Triage Notes (Signed)
Pt presents after just being seen Monday. Patient has been having shortness of breath that is ongoing. She was diagnosed with an URI. Pt is labored during intake and has audible wheezing. Pt has been taking all medications as prescribed.

## 2020-11-19 NOTE — ED Provider Notes (Signed)
[facility]    CSN: [id number] Arrival date & time: 11/15/20  1308      History   Chief Complaint Chief Complaint  Patient presents with   Shortness of Breath    HPI Ruth Dixon is a 50 y.o. female.   Presenting today with worsening SOB, wheezing, and cough the past week. States she was seen about 3 days ago for the same and given steroids, antibiotics, and an inhaler which have not been helping. She has a known hx of asthma and is a cigarettes smoker though no dx of COPD. Denies known fever, CP, abdominal pain, N/V/D.    Past Medical History:  Diagnosis Date   Asthma    Hypertension    IBS (irritable bowel syndrome)    Pleurisy    multiple times   Reflux     Patient Active Problem List   Diagnosis Date Noted   Rectal bleeding 01/07/2018   Abdominal pain 01/06/2018   Dysphagia 01/06/2018   Nausea with vomiting 01/06/2018   GERD (gastroesophageal reflux disease) 01/06/2018    Past Surgical History:  Procedure Laterality Date   BIOPSY  01/21/2018   Procedure: BIOPSY;  Surgeon: Daneil Dolin, MD;  [facility];  Service: Endoscopy;;  esopahgus/GE junction   COLONOSCOPY WITH PROPOFOL N/A 01/21/2018   Procedure: COLONOSCOPY WITH PROPOFOL;  Surgeon: Daneil Dolin, MD;  [facility];  Service: Endoscopy;  Laterality: N/A;  12:45pm   ESOPHAGOGASTRODUODENOSCOPY (EGD) WITH PROPOFOL N/A 01/21/2018   Procedure: ESOPHAGOGASTRODUODENOSCOPY (EGD) WITH PROPOFOL;  Surgeon: Daneil Dolin, MD;  [facility];  Service: Endoscopy;  Laterality: N/A;   HERNIA REPAIR     MALONEY DILATION N/A 01/21/2018   Procedure: Venia Minks DILATION;  Surgeon: Daneil Dolin, MD;  [facility];  Service: Endoscopy;  Laterality: N/A;    OB History   No obstetric history on file.      Home Medications    Prior to Admission medications   Medication Sig Start Date End Date Taking? Authorizing Provider  albuterol (VENTOLIN HFA)  108 (90 Base) MCG/ACT inhaler Inhale 1-2 puffs into the lungs every 6 (six) hours as needed for wheezing or shortness of breath. 11/15/20   Long, Wonda Olds, MD  amLODipine (NORVASC) 10 MG tablet Take 10 mg by mouth daily.  04/11/12   [provider]  aspirin EC 81 MG tablet Take 81 mg by mouth daily.    [provider]  azithromycin (ZITHROMAX) 250 MG tablet Start with 2 tablets today, then 1 daily thereafter. 11/12/20   Jaynee Eagles, PA-C  benzonatate (TESSALON) 100 MG capsule Take 1-2 capsules (100-200 mg total) by mouth 3 (three) times daily as needed for cough. 11/12/20   Jaynee Eagles, PA-C  busPIRone (BUSPAR) 10 MG tablet Take 10 mg by mouth 3 (three) times daily.    [provider]  cyclobenzaprine (FLEXERIL) 10 MG tablet Take 1 tablet (10 mg total) by mouth 3 (three) times daily. 10/09/18   Lily Kocher, PA-C  dexamethasone (DECADRON) 4 MG tablet Take 1 tablet (4 mg total) by mouth 2 (two) times daily with a meal. 10/09/18   Lily Kocher, PA-C  ERRIN 0.35 MG tablet TAKE AS DIRECTED BY DOCTOR Patient taking differently: Take 1 tablet by mouth daily.  04/27/15   Florian Buff, MD  FLUoxetine (PROZAC) 40 MG capsule Take 40 mg by mouth daily.    [provider]  furosemide (LASIX) 20 MG tablet Take 40  mg by mouth daily.    [provider]  HYDROcodone-acetaminophen (NORCO) 10-325 MG per tablet Take 1-2 tablets by mouth every 6 (six) hours as needed for moderate pain.  04/12/12   [provider]  ibuprofen (ADVIL) 600 MG tablet Take 1 tablet (600 mg total) by mouth 4 (four) times daily. 10/09/18   Lily Kocher, PA-C  lansoprazole (PREVACID) 15 MG capsule Take 15 mg by mouth daily.    [provider]  lansoprazole (PREVACID) 30 MG capsule TAKE 1 CAPSULE BY MOUTH EVERY DAY 07/19/18   Annitta Needs, NP  lidocaine (LIDODERM) 5 % Place 1 patch onto the skin daily. Remove & Discard patch within 12 hours or as directed by MD 01/25/19   Evalee Jefferson,  PA-C  lisinopril (PRINIVIL,ZESTRIL) 10 MG tablet Take 10 mg by mouth daily.  04/11/12   [provider]  Na Sulfate-K Sulfate-Mg Sulf 17.5-3.13-1.6 GM/177ML SOLN Take 1 kit by mouth as directed. 01/18/18   Rourk, Cristopher Estimable, MD  ondansetron (ZOFRAN ODT) 4 MG disintegrating tablet Take 1 tablet (4 mg total) by mouth every 8 (eight) hours as needed for nausea or vomiting. 05/26/15   Francine Graven, DO  predniSONE (STERAPRED UNI-PAK 21 TAB) 10 MG (21) TBPK tablet Take by mouth daily. Take 6 tabs by mouth daily  for 2 days, then 5 tabs for 2 days, then 4 tabs for 2 days, then 3 tabs for 2 days, 2 tabs for 2 days, then 1 tab by mouth daily for 2 days 11/15/20   Long, Wonda Olds, MD  promethazine-dextromethorphan (PROMETHAZINE-DM) 6.25-15 MG/5ML syrup Take 5 mLs by mouth at bedtime as needed for cough. 11/12/20   Jaynee Eagles, PA-C  venlafaxine (EFFEXOR) 37.5 MG tablet Take 37.5 mg by mouth 2 (two) times daily.    [provider]    Family History Family History  Problem Relation Age of Onset   Congestive Heart Failure Mother    Diabetes Maternal Grandmother    COPD Maternal Grandmother    Stroke Maternal Grandmother    COPD Maternal Grandfather    COPD Paternal Grandmother    COPD Paternal Grandfather    Colon cancer Neg Hx     Social History Social History   Tobacco Use   Smoking status: Every Day    Packs/day: 1.00    Types: Cigarettes   Smokeless tobacco: Never  Vaping Use   Vaping Use: Never used  Substance Use Topics   Alcohol use: Not Currently    Comment: 2 beers every night   Drug use: No     Allergies   Patient has no known allergies.   Review of Systems Review of Systems PER HPI   Physical Exam Triage Vital Signs ED Triage Vitals  Enc Vitals Group     BP 11/15/20 1325 128/66     Pulse Rate 11/15/20 1325 (!) 101     Resp 11/15/20 1325 19     Temp 11/15/20 1325 98 F (36.7 C)     Temp src --      SpO2 11/15/20 1325 (!) 88 %     Weight --       Height --      Head Circumference --      Peak Flow --      Pain Score 11/15/20 1323 0     Pain Loc --      Pain Edu? --      Excl. in [place]? --    No data found.  Updated Vital Signs BP 128/66   Pulse (!) 101   Temp 98 F (36.7 C)   Resp 19   SpO2 (!) 88%   Visual Acuity Right Eye Distance:   Left Eye Distance:   Bilateral Distance:    Right Eye Near:   Left Eye Near:    Bilateral Near:      PE abbreviated today as decision was made to transport to ED for further management Physical Exam Vitals and nursing note reviewed.  Constitutional:      General: She is in acute distress.     Appearance: She is ill-appearing.     Comments: Tripod positioning in exam room, labored breathing, unable to speak full sentences, audible wheezing   HENT:     Head: Atraumatic.     Mouth/Throat:     Mouth: Mucous membranes are moist.  Eyes:     Extraocular Movements: Extraocular movements intact.     Conjunctiva/sclera: Conjunctivae normal.  Cardiovascular:     Rate and Rhythm: Regular rhythm. Tachycardia present.     Heart sounds: Normal heart sounds.  Pulmonary:     Breath sounds: No rales.     Comments: Tachypneic, severe diffuse wheezes, labored breathing, unable to speak full sentences, tripod positioning Musculoskeletal:        General: Normal range of motion.     Cervical back: Normal range of motion and neck supple.  Skin:    General: Skin is warm and dry.  Neurological:     Mental Status: She is oriented to person, place, and time.  Psychiatric:     Comments: Agitated, argumentative     UC Treatments / Results  Labs (all labs ordered are listed, but only abnormal results are displayed) Labs Reviewed - No data to display  EKG   Radiology No results found.  Procedures Procedures (including critical care time)  Medications Ordered in UC Medications - No data to display  Initial Impression / Assessment and Plan / UC Course  I have reviewed the triage vital  signs and the nursing notes.  Pertinent labs & imaging results that were available during my care of the patient were reviewed by me and considered in my medical decision making (see chart for details).     Patient in obvious respiratory distress upon arrival and immediately brought back for triage. She was found to be tachycardic and hypoxic to 87-88% on room air. She improved to 92% on 4 L O2 nasal cannula but still unable to speak full sentences and in obvious distress. As she has already been on inhalers, steroids, abx feel her oxygen requirements merit further monitoring and management in the ED at this time. EMS called but patient adamantly refusing transport. Discussion was had that her condition could be life threatening if left untreated further and that I strongly do not recommend private vehicle transport as she drove herself and absolutely cannot recommend going home for observation which she is requesting. Ultimately she decided to drive herself to the ED for evaluation despite our best recommendations.   Final Clinical Impressions(s) / UC Diagnoses   Final diagnoses:  Acute respiratory failure with hypoxia (HCC)  Wheezing  SOB (shortness of breath)  Tachycardia   Discharge Instructions   None    ED Prescriptions   None    PDMP not reviewed this encounter.   Volney American, [place] 11/19/20 2217

## 2021-06-17 ENCOUNTER — Ambulatory Visit
Admission: EM | Admit: 2021-06-17 | Discharge: 2021-06-17 | Disposition: A | Discharge status: Home / Self Care | Payer: No Typology Code available for payment source

## 2021-06-17 DIAGNOSIS — R0602 Shortness of breath: Secondary | ICD-10-CM | POA: Diagnosis not present

## 2021-06-17 MED ORDER — ALBUTEROL SULFATE (2.5 MG/3ML) 0.083% IN NEBU
2.5000 mg | INHALATION_SOLUTION | Freq: Once | RESPIRATORY_TRACT | Status: AC
  Administered 2021-06-17: 2.5 mg via RESPIRATORY_TRACT

## 2021-06-17 NOTE — ED Triage Notes (Signed)
Pt presents with sore throat, cough and SOB that began Wednesday. Pt was seen by PCP on Thursday and had negative strep. During triage pts O2 was noted to fluctuate between 88-94 ?

## 2021-06-17 NOTE — Discharge Instructions (Signed)
Continue your albuterol treatments at home every 6 hours as needed. ?Finish your other medications as prescribed by your primary care. ? ?Please return to the urgent care or emergency department if symptoms worsen or do not improve. ?

## 2021-06-17 NOTE — ED Provider Notes (Signed)
?[facility] ? ? ? ?CSN: [id number] ?Arrival date & time: 06/17/21  1415 ? ? ?  ? ?History   ?Chief Complaint ?Chief Complaint  ?Patient presents with  ? Shortness of Breath  ? ? ?HPI ?Ruth Dixon is a 51 y.o. female.  ?She presents with shortness of breath that began Wednesday.  She went to her PCP on Thursday who prescribed multiple prednisone taper, Augmentin, and albuterol syrup.  She also received a steroid shot during that visit.  Patient states she has remained short of breath since her visit with her PCP.  She has not been using her at home nebulizer treatments.  She does have seasonal allergies and states she has been congested due to pollen.  She does have chronic cough.  History of asthma and COPD, is a cigarette smoker. ?Denies fever, headache, dizziness, chest pain, chest tightness, hemoptysis, weakness. ? ?Past Medical History:  ?Diagnosis Date  ? Asthma   ? Hypertension   ? IBS (irritable bowel syndrome)   ? Pleurisy   ? multiple times  ? Reflux   ? ? ?Patient Active Problem List  ? Diagnosis Date Noted  ? Rectal bleeding 01/07/2018  ? Abdominal pain 01/06/2018  ? Dysphagia 01/06/2018  ? Nausea with vomiting 01/06/2018  ? GERD (gastroesophageal reflux disease) 01/06/2018  ? ? ?Past Surgical History:  ?Procedure Laterality Date  ? BIOPSY  01/21/2018  ? Procedure: BIOPSY;  Surgeon: Corbin Adeourk, Robert M, MD;  Location: [facility];  Service: Endoscopy;;  esopahgus/GE junction  ? COLONOSCOPY WITH PROPOFOL N/A 01/21/2018  ? Procedure: COLONOSCOPY WITH PROPOFOL;  Surgeon: Corbin Adeourk, Robert M, MD;  Location: [facility];  Service: Endoscopy;  Laterality: N/A;  12:45pm  ? ESOPHAGOGASTRODUODENOSCOPY (EGD) WITH PROPOFOL N/A 01/21/2018  ? Procedure: ESOPHAGOGASTRODUODENOSCOPY (EGD) WITH PROPOFOL;  Surgeon: Corbin Adeourk, Robert M, MD;  Location: [facility];  Service: Endoscopy;  Laterality: N/A;  ? HERNIA REPAIR    ? MALONEY DILATION N/A 01/21/2018  ? Procedure: MALONEY DILATION;  Surgeon: Corbin Adeourk, Robert M,  MD;  Location: [facility];  Service: Endoscopy;  Laterality: N/A;  ? ? ?OB History   ?No obstetric history on file. ?  ? ? ? ?Home Medications   ? ?Prior to Admission medications   ?Medication Sig Start Date End Date Taking? Authorizing Provider  ?albuterol (VENTOLIN) 2 MG/5ML syrup Take 2 mg by mouth 3 (three) times daily.   Yes [provider]  ?ALBUTEROL IN Inhale into the lungs.   Yes [provider]  ?amoxicillin-clavulanate (AUGMENTIN) 875-125 MG tablet Take 1 tablet by mouth 2 (two) times daily.   Yes [provider]  ?methylPREDNISolone (MEDROL) 4 MG tablet Take 4 mg by mouth daily.   Yes [provider]  ?albuterol (VENTOLIN HFA) 108 (90 Base) MCG/ACT inhaler Inhale 1-2 puffs into the lungs every 6 (six) hours as needed for wheezing or shortness of breath. 11/15/20   Long, Arlyss RepressJoshua G, MD  ?busPIRone (BUSPAR) 10 MG tablet Take 10 mg by mouth 3 (three) times daily.    [provider]  ?furosemide (LASIX) 20 MG tablet Take 40 mg by mouth daily.    [provider]  ?ibuprofen (ADVIL) 600 MG tablet Take 1 tablet (600 mg total) by mouth 4 (four) times daily. 10/09/18   Ivery QualeBryant, Hobson, PA-C  ? ? ?Family History ?Family History  ?Problem Relation Age of Onset  ? Congestive Heart Failure Mother   ? Diabetes Maternal Grandmother   ? COPD Maternal Grandmother   ?  Stroke Maternal Grandmother   ? COPD Maternal Grandfather   ? COPD Paternal Grandmother   ? COPD Paternal Grandfather   ? Colon cancer Neg Hx   ? ? ?Social History ?Social History  ? ?Tobacco Use  ? Smoking status: Every Day  ?  Packs/day: 1.00  ?  Types: Cigarettes  ? Smokeless tobacco: Never  ?Vaping Use  ? Vaping Use: Never used  ?Substance Use Topics  ? Alcohol use: Not Currently  ?  Comment: 2 beers every night  ? Drug use: No  ? ? ? ?Allergies   ?Patient has no known allergies. ? ? ?Review of Systems ?Review of Systems  ?Respiratory:  Positive for shortness of breath.   ? ?Per HPI ? ?Physical  Exam ?Triage Vital Signs ?ED Triage Vitals  ?Enc Vitals Group  ?   BP 06/17/21 1459 108/70  ?   Pulse Rate 06/17/21 1459 88  ?   Resp 06/17/21 1459 20  ?   Temp 06/17/21 1459 98.1 ?F (36.7 ?C)  ?   Temp Source 06/17/21 1459 Oral  ?   SpO2 06/17/21 1459 92 %  ?   Weight --   ?   Height --   ?   Head Circumference --   ?   Peak Flow --   ?   Pain Score 06/17/21 1501 0  ?   Pain Loc --   ?   Pain Edu? --   ?   Excl. in [place]? --   ? ?No data found. ? ?Updated Vital Signs ?BP 108/70 (BP Location: Right Arm)   Pulse 88   Temp 98.1 ?F (36.7 ?C) (Oral)   Resp 20   LMP 02/21/2017 (Within Weeks)   SpO2 94%  ? ? ?Physical Exam ?Vitals and nursing note reviewed.  ?Constitutional:   ?   Appearance: She is well-developed.  ?   Comments: Very pleasant female, resting in chair, appears fatigued  ?HENT:  ?   Mouth/Throat:  ?   Mouth: Mucous membranes are moist.  ?   Pharynx: Oropharynx is clear.  ?Eyes:  ?   Conjunctiva/sclera: Conjunctivae normal.  ?Cardiovascular:  ?   Rate and Rhythm: Normal rate and regular rhythm.  ?Pulmonary:  ?   Breath sounds: Wheezing present.  ?   Comments: Diffuse wheezing in all lung fields. Patient has some trouble speaking in full sentences.  ?Abdominal:  ?   Palpations: Abdomen is soft.  ?   Tenderness: There is no abdominal tenderness.  ?Musculoskeletal:     ?   General: No swelling.  ?   Right lower leg: No edema.  ?   Left lower leg: No edema.  ?Skin: ?   General: Skin is warm and dry.  ?   Coloration: Skin is not cyanotic.  ?Neurological:  ?   Mental Status: She is alert and oriented to person, place, and time.  ? ? ? ?UC Treatments / Results  ?Labs ?(all labs ordered are listed, but only abnormal results are displayed) ?Labs Reviewed - No data to display ? ?EKG ? ?Radiology ?No results found. ? ?Procedures ?Procedures (including critical care time) ? ?Medications Ordered in UC ?Medications  ?albuterol (PROVENTIL) (2.5 MG/3ML) 0.083% nebulizer solution 2.5 mg (2.5 mg Nebulization Given 06/17/21  1552)  ? ? ?Initial Impression / Assessment and Plan / UC Course  ?I have reviewed the triage vital signs and the nursing notes. ? ?Pertinent labs & imaging results that were available during my care of the  patient were reviewed by me and considered in my medical decision making (see chart for details). ?  ?On initial assessment patient presents with diffuse wheezing to all lung fields. After DuoNeb treatment patient states she is feeling much better, is able to speak in full sentences, and feels like she can "run around".  Wheezing has improved.  Patient's O2 sats remain 94% in clinic.  We discussed using her nebulizer treatments at home every 6 hours as needed for shortness of breath.  She will also finish her Augmentin and steroid taper that was prescribed at her primary care.  We discussed return precautions and patient agrees with plan.  She is discharged in stable condition. ? ?Final Clinical Impressions(s) / UC Diagnoses  ? ?Final diagnoses:  ?Shortness of breath  ? ? ? ?Discharge Instructions   ? ?  ?Continue your albuterol treatments at home every 6 hours as needed. ?Finish your other medications as prescribed by your primary care. ? ?Please return to the urgent care or emergency department if symptoms worsen or do not improve. ? ? ? ?ED Prescriptions   ?None ?  ? ?PDMP not reviewed this encounter. ?  ?Bonnie Roig, Lurena Joiner, PA-C ?06/17/21 1634 ? ?

## 2021-11-01 IMAGING — DX DG CHEST 2V
2 series · 2 of 2 positions shown · non-contrast
Comparison: Chest x-ray 01/25/2019, CT chest 12/19/2014

CLINICAL DATA: Shortness of breath

EXAM:
CHEST - 2 VIEW

[chest pa]
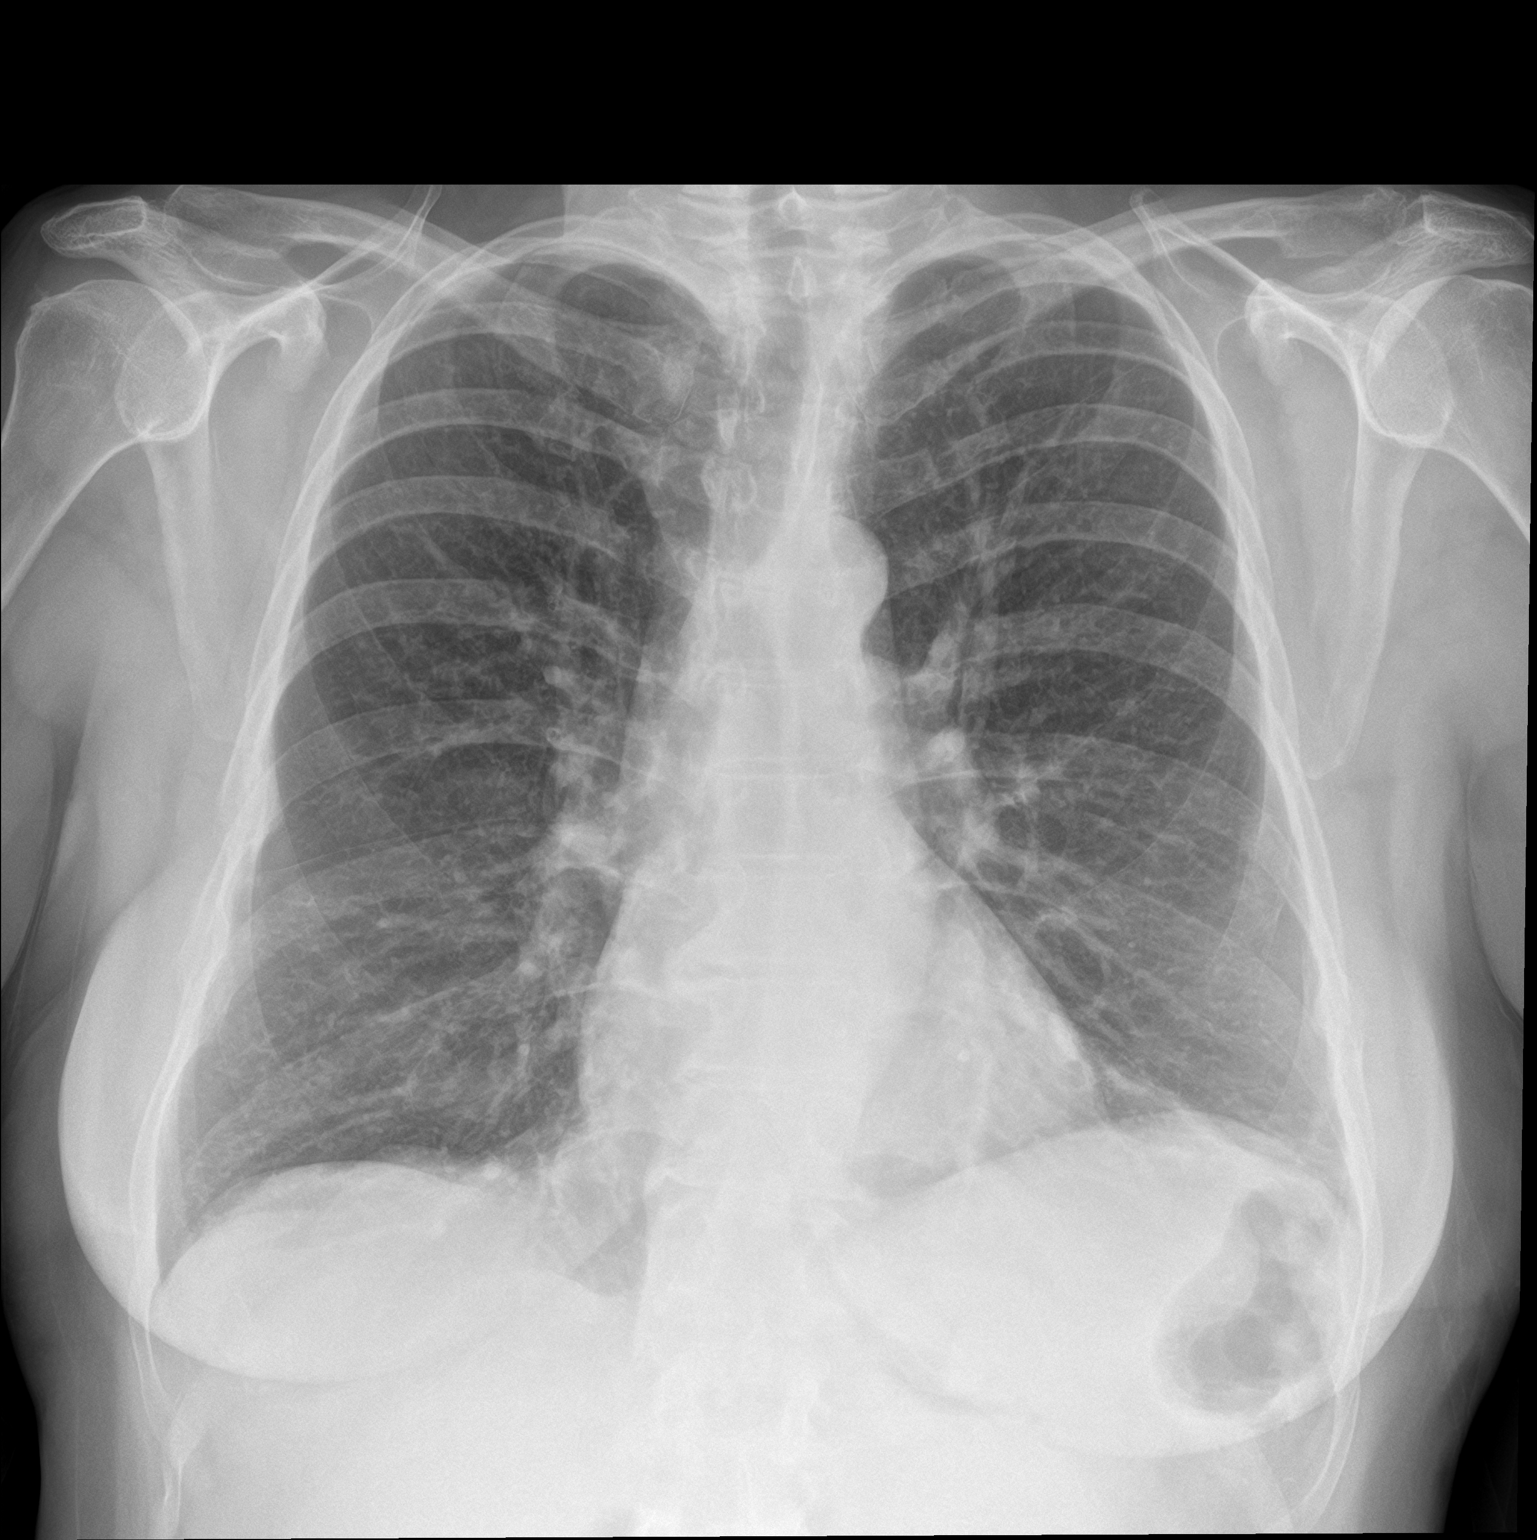

[chest lat]
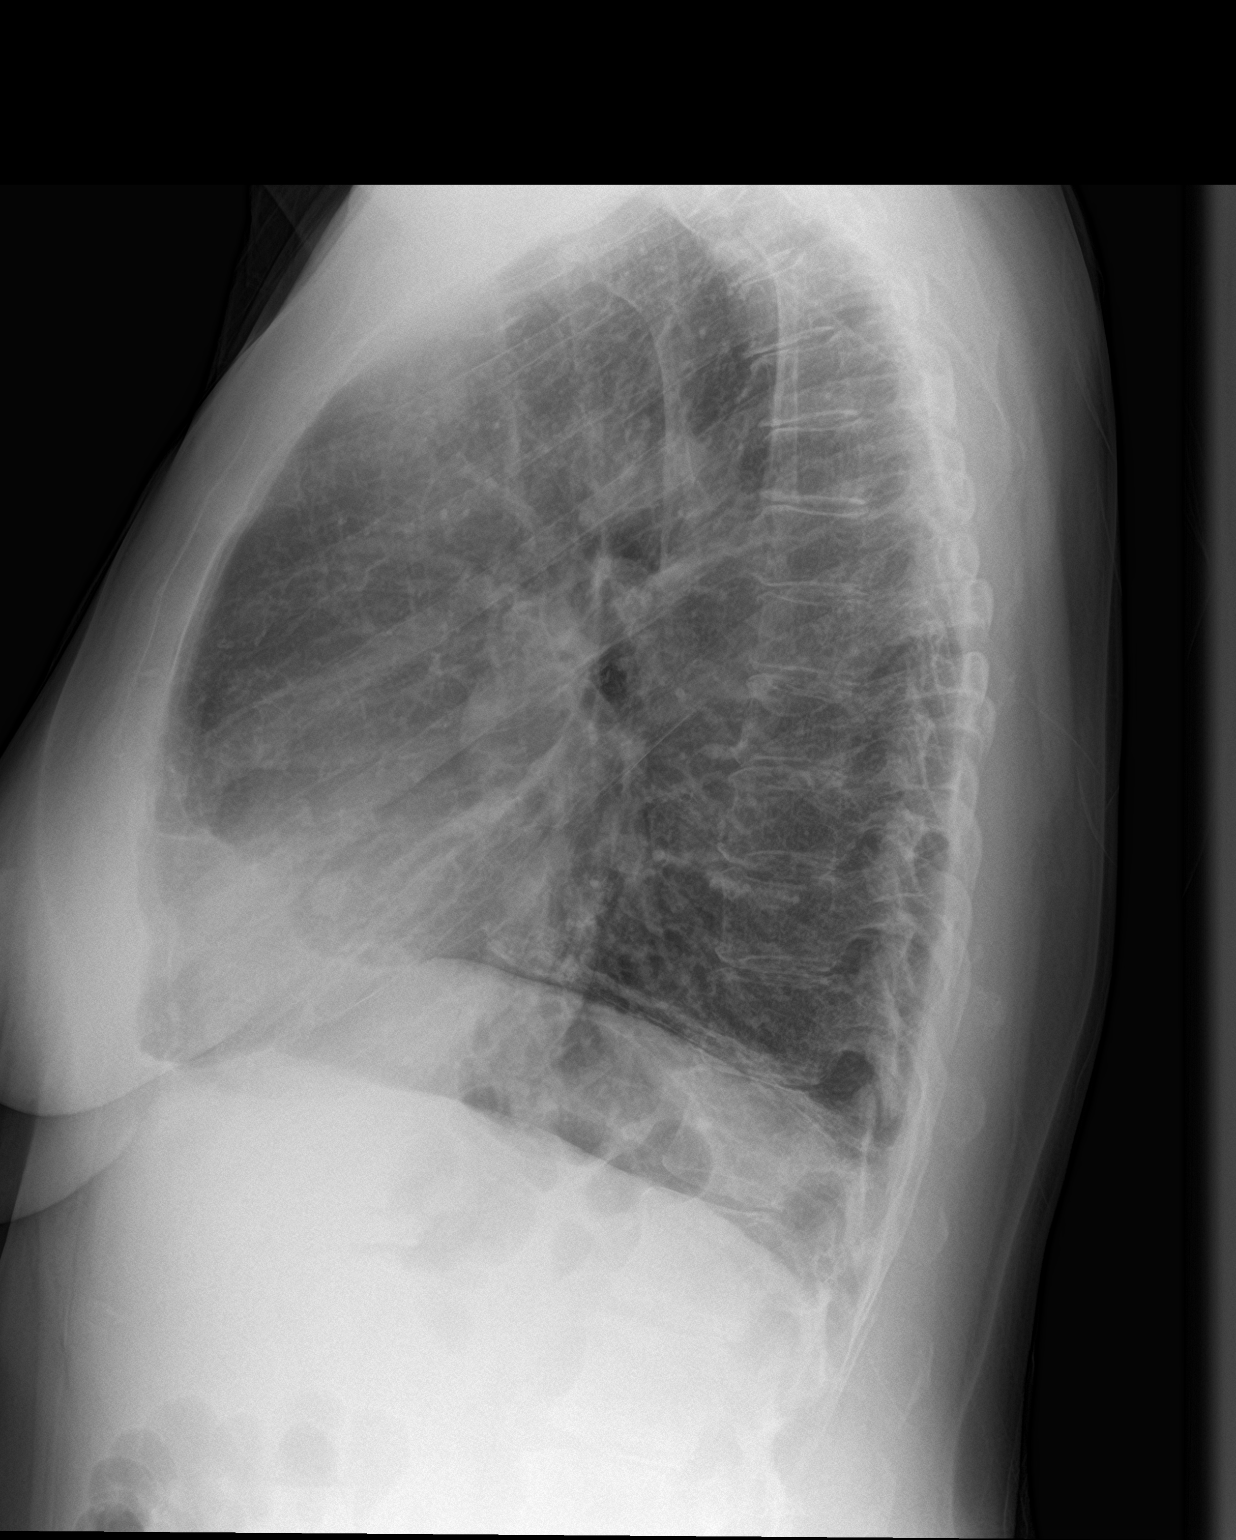

[2 of 2 positions shown; findings below may reference images not displayed]

FINDINGS: The heart and mediastinal contours are within normal limits.

Bibasilar atelectasis. No focal consolidation. No pulmonary edema.
No pleural effusion. No pneumothorax.

No acute osseous abnormality.
IMPRESSION: No active cardiopulmonary disease.

## 2021-11-25 ENCOUNTER — Ambulatory Visit (INDEPENDENT_AMBULATORY_CARE_PROVIDER_SITE_OTHER): Payer: No Typology Code available for payment source

## 2021-11-25 ENCOUNTER — Ambulatory Visit
Admission: EM | Admit: 2021-11-25 | Discharge: 2021-11-25 | Disposition: A | Discharge status: Home / Self Care | Payer: No Typology Code available for payment source

## 2021-11-25 DIAGNOSIS — J209 Acute bronchitis, unspecified: Secondary | ICD-10-CM

## 2021-11-25 DIAGNOSIS — R062 Wheezing: Secondary | ICD-10-CM

## 2021-11-25 DIAGNOSIS — R0602 Shortness of breath: Secondary | ICD-10-CM | POA: Diagnosis not present

## 2021-11-25 MED ORDER — IPRATROPIUM-ALBUTEROL 0.5-2.5 (3) MG/3ML IN SOLN
3.0000 mL | Freq: Once | RESPIRATORY_TRACT | Status: AC
  Administered 2021-11-25: 3 mL via RESPIRATORY_TRACT

## 2021-11-25 MED ORDER — PREDNISONE 50 MG PO TABS: ORAL_TABLET | ORAL | 0 refills | Status: DC

## 2021-11-25 MED ORDER — METHYLPREDNISOLONE SODIUM SUCC 125 MG IJ SOLR
125.0000 mg | Freq: Once | INTRAMUSCULAR | Status: AC
  Administered 2021-11-25: 125 mg via INTRAMUSCULAR

## 2021-11-25 MED ORDER — DOXYCYCLINE HYCLATE 100 MG PO TABS: 100.0000 mg | ORAL_TABLET | Freq: Two times a day (BID) | ORAL | 0 refills | Status: AC

## 2021-11-25 MED ORDER — ALBUTEROL SULFATE (2.5 MG/3ML) 0.083% IN NEBU: 2.5000 mg | INHALATION_SOLUTION | Freq: Four times a day (QID) | RESPIRATORY_TRACT | 2 refills | Status: DC | PRN

## 2021-11-25 MED ORDER — PROMETHAZINE-DM 6.25-15 MG/5ML PO SYRP: 5.0000 mL | ORAL_SOLUTION | Freq: Four times a day (QID) | ORAL | 0 refills | Status: DC | PRN

## 2021-11-25 MED ORDER — ALBUTEROL SULFATE HFA 108 (90 BASE) MCG/ACT IN AERS: 2.0000 | INHALATION_SPRAY | Freq: Four times a day (QID) | RESPIRATORY_TRACT | 0 refills | Status: DC | PRN

## 2021-11-25 NOTE — ED Triage Notes (Signed)
cough HA and fever hx of bronchitis

## 2021-11-25 NOTE — Discharge Instructions (Addendum)
Your chest x-ray does not show any acute cause of your wheezing or shortness of breath. Take medication as prescribed. Increase fluids and allow for plenty of rest. May take over-the-counter Tylenol or ibuprofen as needed for pain, fever, or general discomfort. Recommend using a humidifier in your bedroom at night to help during sleep.  Also recommend sleeping elevated on 2 pillows while cough symptoms persist. Please consider trying to decrease your smoking to eventually lead to you to stop smoking. Go to the emergency department if you experience worsening cough, wheezing, shortness of breath, or other concerns. Follow-up with your primary care physician if symptoms fail to improve after you have completed this treatment. Follow-up as needed.

## 2021-11-25 NOTE — ED Provider Notes (Signed)
[facility]    CSN: [id number] Arrival date & time: 11/25/21  1650      History   Chief Complaint Chief Complaint  Patient presents with   Cough   Nasal Congestion    In lungs    HPI Ruth Dixon is a 51 y.o. female.   The history is provided by the patient.   Patient reports with a 2-day history of fatigue, cough, wheezing, shortness of breath, and chest congestion.  Patient reports that she has a history of recurrent bronchitis.  She denies fever, chills, sore throat, pain, trouble breathing, or difficulty breathing.  Patient's mother states patient is a current smoker and smokes quite a bit.  Patient has not taken any medication for her symptoms.\  Past Medical History:  Diagnosis Date   Asthma    Hypertension    IBS (irritable bowel syndrome)    Pleurisy    multiple times   Reflux     Patient Active Problem List   Diagnosis Date Noted   Rectal bleeding 01/07/2018   Abdominal pain 01/06/2018   Dysphagia 01/06/2018   Nausea with vomiting 01/06/2018   GERD (gastroesophageal reflux disease) 01/06/2018    Past Surgical History:  Procedure Laterality Date   BIOPSY  01/21/2018   Procedure: BIOPSY;  Surgeon: Daneil Dolin, MD;  Location: [facility];  Service: Endoscopy;;  esopahgus/GE junction   COLONOSCOPY WITH PROPOFOL N/A 01/21/2018   Procedure: COLONOSCOPY WITH PROPOFOL;  Surgeon: Daneil Dolin, MD;  [facility]: [facility];  Service: Endoscopy;  Laterality: N/A;  12:45pm   ESOPHAGOGASTRODUODENOSCOPY (EGD) WITH PROPOFOL N/A 01/21/2018   Procedure: ESOPHAGOGASTRODUODENOSCOPY (EGD) WITH PROPOFOL;  Surgeon: Daneil Dolin, MD;  Location: [facility];  Service: Endoscopy;  Laterality: N/A;   HERNIA REPAIR     MALONEY DILATION N/A 01/21/2018   Procedure: Venia Minks DILATION;  Surgeon: Daneil Dolin, MD;  Location: [facility];  Service: Endoscopy;  Laterality: N/A;    OB History   No obstetric history on file.      Home  Medications    Prior to Admission medications   Medication Sig Start Date End Date Taking? Authorizing Provider  albuterol (PROVENTIL) (2.5 MG/3ML) 0.083% nebulizer solution Take 3 mLs (2.5 mg total) by nebulization every 6 (six) hours as needed for wheezing or shortness of breath. 11/25/21  Yes Hidaya Daniel-Warren, Alda Lea, NP  albuterol (VENTOLIN HFA) 108 (90 Base) MCG/ACT inhaler Inhale 2 puffs into the lungs every 6 (six) hours as needed for wheezing or shortness of breath. 11/25/21  Yes Cadarius Nevares-Warren, Alda Lea, NP  doxycycline (VIBRA-TABS) 100 MG tablet Take 1 tablet (100 mg total) by mouth 2 (two) times daily for 7 days. 11/25/21 12/02/21 Yes Aldora Perman-Warren, Alda Lea, NP  predniSONE (DELTASONE) 50 MG tablet Take 1 tablet daily with breakfast for 5 days. 11/25/21  Yes Kyo Cocuzza-Warren, Alda Lea, NP  promethazine-dextromethorphan (PROMETHAZINE-DM) 6.25-15 MG/5ML syrup Take 5 mLs by mouth 4 (four) times daily as needed for cough. 11/25/21  Yes Harve Spradley-Warren, Alda Lea, NP  ALBUTEROL IN Inhale into the lungs.    [provider]  amoxicillin-clavulanate (AUGMENTIN) 875-125 MG tablet Take 1 tablet by mouth 2 (two) times daily.    [provider]  busPIRone (BUSPAR) 10 MG tablet Take 10 mg by mouth 3 (three) times daily.    [provider]  furosemide (LASIX) 20 MG tablet Take 40 mg by mouth daily.    [provider]  HYDROcodone-acetaminophen (NORCO) 10-325 MG tablet Take  1 tablet by mouth every 4 (four) hours as needed. 11/22/21   [provider]  ibuprofen (ADVIL) 600 MG tablet Take 1 tablet (600 mg total) by mouth 4 (four) times daily. 10/09/18   Lily Kocher, PA-C  methylPREDNISolone (MEDROL) 4 MG tablet Take 4 mg by mouth daily.    [provider]    Family History Family History  Problem Relation Age of Onset   Congestive Heart Failure Mother    Diabetes Maternal Grandmother    COPD Maternal Grandmother    Stroke Maternal Grandmother     COPD Maternal Grandfather    COPD Paternal Grandmother    COPD Paternal Grandfather    Colon cancer Neg Hx     Social History Social History   Tobacco Use   Smoking status: Every Day    Packs/day: 1.00    Types: Cigarettes   Smokeless tobacco: Never  Vaping Use   Vaping Use: Never used  Substance Use Topics   Alcohol use: Not Currently    Comment: 2 beers every night   Drug use: No     Allergies   Patient has no known allergies.   Review of Systems Review of Systems Per HPI  Physical Exam Triage Vital Signs ED Triage Vitals [11/25/21 1659]  Enc Vitals Group     BP 132/78     Pulse Rate 87     Resp 18     Temp 98 F (36.7 C)     Temp Source Oral     SpO2 93 %     Weight      Height      Head Circumference      Peak Flow      Pain Score 7     Pain Loc      Pain Edu?      Excl. in [place]?    No data found.  Updated Vital Signs BP 132/78 (BP Location: Right Arm)   Pulse 87   Temp 98 F (36.7 C) (Oral)   Resp 18   LMP 02/21/2017 (Within Weeks)   SpO2 93%   Visual Acuity Right Eye Distance:   Left Eye Distance:   Bilateral Distance:    Right Eye Near:   Left Eye Near:    Bilateral Near:     Physical Exam Vitals and nursing note reviewed.  Constitutional:      General: She is not in acute distress.    Appearance: Normal appearance.  HENT:     Head: Normocephalic.     Right Ear: Tympanic membrane, ear canal and external ear normal.     Left Ear: Tympanic membrane, ear canal and external ear normal.     Nose: Nose normal.     Mouth/Throat:     Mouth: Mucous membranes are moist.     Pharynx: Posterior oropharyngeal erythema present.  Eyes:     Extraocular Movements: Extraocular movements intact.     Conjunctiva/sclera: Conjunctivae normal.     Pupils: Pupils are equal, round, and reactive to light.  Cardiovascular:     Rate and Rhythm: Normal rate and regular rhythm.     Pulses: Normal pulses.     Heart sounds: Normal heart sounds.   Pulmonary:     Effort: Pulmonary effort is normal. No respiratory distress.     Breath sounds: No stridor. Examination of the right-upper field reveals wheezing. Examination of the left-upper field reveals wheezing. Examination of the right-lower field reveals wheezing. Examination of the left-lower field  reveals wheezing. Wheezing present. No rhonchi or rales.     Comments: Post nebulizer treatment, lung sounds are clear throughout.  No wheezing, rales, or rhonchi present. Chest:     Chest wall: No tenderness.  Abdominal:     General: Bowel sounds are normal.     Palpations: Abdomen is soft.     Tenderness: There is no abdominal tenderness.  Musculoskeletal:     Cervical back: Normal range of motion.  Lymphadenopathy:     Cervical: No cervical adenopathy.  Skin:    General: Skin is warm and dry.  Neurological:     General: No focal deficit present.     Mental Status: She is alert and oriented to person, place, and time.  Psychiatric:        Mood and Affect: Mood normal.        Behavior: Behavior normal.      UC Treatments / Results  Labs (all labs ordered are listed, but only abnormal results are displayed) Labs Reviewed - No data to display  EKG   Radiology DG Chest 2 View  Result Date: 11/25/2021 CLINICAL DATA:  51 year old female with wheezing and shortness of breath EXAM: CHEST - 2 VIEW COMPARISON:  11/15/2020 FINDINGS: Cardiomediastinal silhouette unchanged in size and contour. No evidence of central vascular congestion. No interlobular septal thickening. No pneumothorax or pleural effusion. Coarsened interstitial markings, with no confluent airspace disease. No acute displaced fracture. Degenerative changes of the spine. IMPRESSION: Negative for acute cardiopulmonary disease Electronically Signed   By: Corrie Mckusick D.O.   On: 11/25/2021 17:50    Procedures Procedures (including critical care time)  Medications Ordered in UC Medications  ipratropium-albuterol  (DUONEB) 0.5-2.5 (3) MG/3ML nebulizer solution 3 mL (3 mLs Nebulization Given 11/25/21 1745)  methylPREDNISolone sodium succinate (SOLU-MEDROL) 125 mg/2 mL injection 125 mg (125 mg Intramuscular Given 11/25/21 1744)    Initial Impression / Assessment and Plan / UC Course  I have reviewed the triage vital signs and the nursing notes.  Pertinent labs & imaging results that were available during my care of the patient were reviewed by me and considered in my medical decision making (see chart for details).  Patient presents with a 2-day history of cough, wheezing, and shortness of breath.  Chest x-ray was negative for any acute cardiopulmonary disease at this time.  Patient and improved lung sounds post nebulizer treatment.  There was no wheezing present.  Symptoms are consistent with acute bronchitis versus COPD.  We will start patient on doxycycline 100 mg, albuterol inhaler, albuterol nebulizer treatment, prednisone 50 mg, and Promethazine DM for her symptoms.  Patient was advised to consider smoking cessation to help decrease recurrence of her symptoms and lessen her diagnosis for COPD.  Supportive care recommendations were provided to the patient along with strict indications of when to go to the emergency department.  Patient verbalizes understanding.  All questions were answered.  Patient is stable for discharge. Final Clinical Impressions(s) / UC Diagnoses   Final diagnoses:  Acute bronchitis, unspecified organism  Wheezing     Discharge Instructions      Your chest x-ray does not show any acute cause of your wheezing or shortness of breath. Take medication as prescribed. Increase fluids and allow for plenty of rest. May take over-the-counter Tylenol or ibuprofen as needed for pain, fever, or general discomfort. Recommend using a humidifier in your bedroom at night to help during sleep.  Also recommend sleeping elevated on 2 pillows while cough symptoms persist.  Please consider trying  to decrease your smoking to eventually lead to you to stop smoking. Go to the emergency department if you experience worsening cough, wheezing, shortness of breath, or other concerns. Follow-up with your primary care physician if symptoms fail to improve after you have completed this treatment. Follow-up as needed.     ED Prescriptions     Medication Sig Dispense Auth. Provider   doxycycline (VIBRA-TABS) 100 MG tablet Take 1 tablet (100 mg total) by mouth 2 (two) times daily for 7 days. 14 tablet Bernie Ransford-Warren, Alda Lea, NP   predniSONE (DELTASONE) 50 MG tablet Take 1 tablet daily with breakfast for 5 days. 5 tablet Dondra Rhett-Warren, Alda Lea, NP   promethazine-dextromethorphan (PROMETHAZINE-DM) 6.25-15 MG/5ML syrup Take 5 mLs by mouth 4 (four) times daily as needed for cough. 118 mL Dominick Morella-Warren, Alda Lea, NP   albuterol (VENTOLIN HFA) 108 (90 Base) MCG/ACT inhaler Inhale 2 puffs into the lungs every 6 (six) hours as needed for wheezing or shortness of breath. 8 g Dezirae Service-Warren, Alda Lea, NP   albuterol (PROVENTIL) (2.5 MG/3ML) 0.083% nebulizer solution Take 3 mLs (2.5 mg total) by nebulization every 6 (six) hours as needed for wheezing or shortness of breath. 75 mL Capitola Ladson-Warren, Alda Lea, NP      PDMP not reviewed this encounter.   Tish Men, NP 11/25/21 [phone]

## 2022-02-25 ENCOUNTER — Other Ambulatory Visit: Payer: Self-pay

## 2022-02-25 ENCOUNTER — Ambulatory Visit
Admission: EM | Admit: 2022-02-25 | Discharge: 2022-02-25 | Disposition: A | Discharge status: Home / Self Care | Payer: No Typology Code available for payment source | Attending: Family Medicine | Admitting: Family Medicine

## 2022-02-25 ENCOUNTER — Encounter: Payer: Self-pay | Admitting: Emergency Medicine

## 2022-02-25 DIAGNOSIS — U071 COVID-19: Secondary | ICD-10-CM | POA: Diagnosis not present

## 2022-02-25 DIAGNOSIS — J4541 Moderate persistent asthma with (acute) exacerbation: Secondary | ICD-10-CM | POA: Diagnosis not present

## 2022-02-25 MED ORDER — ALBUTEROL SULFATE HFA 108 (90 BASE) MCG/ACT IN AERS: 2.0000 | INHALATION_SPRAY | RESPIRATORY_TRACT | 0 refills | Status: DC | PRN

## 2022-02-25 MED ORDER — ALBUTEROL SULFATE (2.5 MG/3ML) 0.083% IN NEBU: 2.5000 mg | INHALATION_SOLUTION | RESPIRATORY_TRACT | 2 refills | Status: DC | PRN

## 2022-02-25 MED ORDER — PROMETHAZINE-DM 6.25-15 MG/5ML PO SYRP: 5.0000 mL | ORAL_SOLUTION | Freq: Four times a day (QID) | ORAL | 0 refills | Status: DC | PRN

## 2022-02-25 MED ORDER — PAXLOVID (300/100) 20 X 150 MG & 10 X 100MG PO TBPK: 3.0000 | ORAL_TABLET | Freq: Two times a day (BID) | ORAL | 0 refills | Status: AC

## 2022-02-25 MED ORDER — PREDNISONE 50 MG PO TABS: ORAL_TABLET | ORAL | 0 refills | Status: DC

## 2022-02-25 NOTE — ED Provider Notes (Signed)
[facility]    CSN: [id number] Arrival date & time: 02/25/22  1240      History   Chief Complaint Chief Complaint  Patient presents with   Fatigue    HPI Ruth Dixon is a 52 y.o. female.   Presenting today with 3-day history of cough, fatigue, generalized body aches, wheezing, chest tightness, nasal congestion.  Denies chest pain, shortness of breath, abdominal pain, nausea vomiting or diarrhea.  Taking her albuterol nebulizer treatments with mild relief but needs a refill on her inhalers and requesting prednisone.  Home COVID test positive this morning.   Past Medical History:  Diagnosis Date   Asthma    Hypertension    IBS (irritable bowel syndrome)    Pleurisy    multiple times   Reflux     Patient Active Problem List   Diagnosis Date Noted   Rectal bleeding 01/07/2018   Abdominal pain 01/06/2018   Dysphagia 01/06/2018   Nausea with vomiting 01/06/2018   GERD (gastroesophageal reflux disease) 01/06/2018    Past Surgical History:  Procedure Laterality Date   BIOPSY  01/21/2018   Procedure: BIOPSY;  Surgeon: Daneil Dolin, MD;  Location: [facility];  Service: Endoscopy;;  esopahgus/GE junction   COLONOSCOPY WITH PROPOFOL N/A 01/21/2018   Procedure: COLONOSCOPY WITH PROPOFOL;  Surgeon: Daneil Dolin, MD;  Location: [facility];  Service: Endoscopy;  Laterality: N/A;  12:45pm   ESOPHAGOGASTRODUODENOSCOPY (EGD) WITH PROPOFOL N/A 01/21/2018   Procedure: ESOPHAGOGASTRODUODENOSCOPY (EGD) WITH PROPOFOL;  Surgeon: Daneil Dolin, MD;  Location: [facility];  Service: Endoscopy;  Laterality: N/A;   HERNIA REPAIR     MALONEY DILATION N/A 01/21/2018   Procedure: Venia Minks DILATION;  Surgeon: Daneil Dolin, MD;  Location: [facility];  Service: Endoscopy;  Laterality: N/A;    OB History   No obstetric history on file.    Home Medications    Prior to Admission medications   Medication Sig Start Date End Date Taking? Authorizing Provider   nirmatrelvir & ritonavir (PAXLOVID, 300/100,) 20 x 150 MG & 10 x 100MG  TBPK Take 3 tablets by mouth 2 (two) times daily for 5 days. 02/25/22 03/02/22 Yes Volney American, PA-C  predniSONE (DELTASONE) 50 MG tablet Take 1 tab daily with breakfast 02/25/22  Yes Volney American, PA-C  albuterol (PROVENTIL) (2.5 MG/3ML) 0.083% nebulizer solution Take 3 mLs (2.5 mg total) by nebulization every 4 (four) hours as needed for wheezing or shortness of breath. 02/25/22   Volney American, PA-C  albuterol (VENTOLIN HFA) 108 (90 Base) MCG/ACT inhaler Inhale 2 puffs into the lungs every 4 (four) hours as needed for wheezing or shortness of breath. 02/25/22   Volney American, PA-C  ALBUTEROL IN Inhale into the lungs.    [provider]  amoxicillin-clavulanate (AUGMENTIN) 875-125 MG tablet Take 1 tablet by mouth 2 (two) times daily.    [provider]  busPIRone (BUSPAR) 10 MG tablet Take 10 mg by mouth 3 (three) times daily.    [provider]  furosemide (LASIX) 20 MG tablet Take 40 mg by mouth daily.    [provider]  HYDROcodone-acetaminophen (NORCO) 10-325 MG tablet Take 1 tablet by mouth every 4 (four) hours as needed. 11/22/21   [provider]  ibuprofen (ADVIL) 600 MG tablet Take 1 tablet (600 mg total) by mouth 4 (four) times daily. 10/09/18   Lily Kocher, PA-C  methylPREDNISolone (MEDROL) 4 MG tablet Take 4 mg by mouth  daily.    [provider]  predniSONE (DELTASONE) 50 MG tablet Take 1 tablet daily with breakfast for 5 days. 11/25/21   Leath-Warren, Sadie Haber, NP  promethazine-dextromethorphan (PROMETHAZINE-DM) 6.25-15 MG/5ML syrup Take 5 mLs by mouth 4 (four) times daily as needed for cough. 02/25/22   Particia Nearing, PA-C    Family History Family History  Problem Relation Age of Onset   Congestive Heart Failure Mother    Diabetes Maternal Grandmother    COPD Maternal Grandmother    Stroke Maternal Grandmother     COPD Maternal Grandfather    COPD Paternal Grandmother    COPD Paternal Grandfather    Colon cancer Neg Hx     Social History Social History   Tobacco Use   Smoking status: Every Day    Packs/day: 1.00    Types: Cigarettes   Smokeless tobacco: Never  Vaping Use   Vaping Use: Never used  Substance Use Topics   Alcohol use: Not Currently    Comment: 2 beers every night   Drug use: No     Allergies   Patient has no known allergies.   Review of Systems Review of Systems PER HPI  Physical Exam Triage Vital Signs ED Triage Vitals  Enc Vitals Group     BP 02/25/22 1407 112/82     Pulse Rate 02/25/22 1407 95     Resp 02/25/22 1407 20     Temp 02/25/22 1407 98 F (36.7 C)     Temp Source 02/25/22 1407 Oral     SpO2 02/25/22 1407 93 %     Weight --      Height --      Head Circumference --      Peak Flow --      Pain Score 02/25/22 1403 0     Pain Loc --      Pain Edu? --      Excl. in GC? --    No data found.  Updated Vital Signs BP 112/82 (BP Location: Right Arm)   Pulse 95   Temp 98 F (36.7 C) (Oral)   Resp 20   LMP 02/21/2017 (Within Weeks)   SpO2 93%   Visual Acuity Right Eye Distance:   Left Eye Distance:   Bilateral Distance:    Right Eye Near:   Left Eye Near:    Bilateral Near:     Physical Exam Vitals and nursing note reviewed.  Constitutional:      Appearance: Normal appearance.  HENT:     Head: Atraumatic.     Right Ear: Tympanic membrane and external ear normal.     Left Ear: Tympanic membrane and external ear normal.     Nose: Rhinorrhea present.     Mouth/Throat:     Mouth: Mucous membranes are moist.     Pharynx: Posterior oropharyngeal erythema present.  Eyes:     Extraocular Movements: Extraocular movements intact.     Conjunctiva/sclera: Conjunctivae normal.  Cardiovascular:     Rate and Rhythm: Normal rate and regular rhythm.     Heart sounds: Normal heart sounds.  Pulmonary:     Effort: Pulmonary effort is  normal.     Breath sounds: Wheezing present.  Musculoskeletal:        General: Normal range of motion.     Cervical back: Normal range of motion and neck supple.  Skin:    General: Skin is warm and dry.  Neurological:     Mental Status: She  is alert and oriented to person, place, and time.  Psychiatric:        Mood and Affect: Mood normal.        Thought Content: Thought content normal.    UC Treatments / Results  Labs (all labs ordered are listed, but only abnormal results are displayed) Labs Reviewed - No data to display  EKG   Radiology No results found.  Procedures Procedures (including critical care time)  Medications Ordered in UC Medications - No data to display  Initial Impression / Assessment and Plan / UC Course  I have reviewed the triage vital signs and the nursing notes.  Pertinent labs & imaging results that were available during my care of the patient were reviewed by me and considered in my medical decision making (see chart for details).     Vital signs overall reassuring today, Home COVID test positive, start Paxlovid, Phenergan DM, prednisone, albuterol inhaler and neb solution.  Discussed supportive home care and return precautions.  Final Clinical Impressions(s) / UC Diagnoses   Final diagnoses:  COVID-19  Moderate persistent asthma with acute exacerbation   Discharge Instructions   None    ED Prescriptions     Medication Sig Dispense Auth. Provider   nirmatrelvir & ritonavir (PAXLOVID, 300/100,) 20 x 150 MG & 10 x 100MG  TBPK Take 3 tablets by mouth 2 (two) times daily for 5 days. 30 tablet Volney American, [place]   promethazine-dextromethorphan (PROMETHAZINE-DM) 6.25-15 MG/5ML syrup Take 5 mLs by mouth 4 (four) times daily as needed for cough. 118 mL Volney American, PA-C   predniSONE (DELTASONE) 50 MG tablet Take 1 tab daily with breakfast 5 tablet Volney American, PA-C   albuterol (VENTOLIN HFA) 108 (90 Base) MCG/ACT  inhaler Inhale 2 puffs into the lungs every 4 (four) hours as needed for wheezing or shortness of breath. [place], [place]   albuterol (PROVENTIL) (2.5 MG/3ML) 0.083% nebulizer solution Take 3 mLs (2.5 mg total) by nebulization every 4 (four) hours as needed for wheezing or shortness of breath. 75 mL Volney American, [place]      PDMP not reviewed this encounter.   Volney American, [place] 02/25/22 1507

## 2022-02-25 NOTE — ED Triage Notes (Signed)
Pt reports cough, fatigue, body aches since Sunday. Pt reports tested positive for covid this am.

## 2022-07-04 ENCOUNTER — Ambulatory Visit
Admission: EM | Admit: 2022-07-04 | Discharge: 2022-07-04 | Disposition: A | Discharge status: Home / Self Care | Payer: No Typology Code available for payment source | Attending: Internal Medicine | Admitting: Internal Medicine

## 2022-07-04 DIAGNOSIS — J4541 Moderate persistent asthma with (acute) exacerbation: Secondary | ICD-10-CM

## 2022-07-04 MED ORDER — METHYLPREDNISOLONE SODIUM SUCC 125 MG IJ SOLR
60.0000 mg | Freq: Once | INTRAMUSCULAR | Status: AC
  Administered 2022-07-04: 60 mg via INTRAMUSCULAR

## 2022-07-04 MED ORDER — DOXYCYCLINE HYCLATE 100 MG PO CAPS: 100.0000 mg | ORAL_CAPSULE | Freq: Two times a day (BID) | ORAL | 0 refills | Status: DC

## 2022-07-04 MED ORDER — ALBUTEROL SULFATE (2.5 MG/3ML) 0.083% IN NEBU: 2.5000 mg | INHALATION_SOLUTION | RESPIRATORY_TRACT | 0 refills | Status: DC | PRN

## 2022-07-04 MED ORDER — PROMETHAZINE VC 6.25-5 MG/5ML PO SYRP: 5.0000 mL | ORAL_SOLUTION | Freq: Every evening | ORAL | 0 refills | Status: DC | PRN

## 2022-07-04 MED ORDER — METHYLPREDNISOLONE SODIUM SUCC 40 MG IJ SOLR: 10.0000 mg | Freq: Once | INTRAMUSCULAR | Status: DC

## 2022-07-04 NOTE — ED Provider Notes (Signed)
[facility]    CSN: [id number] Arrival date & time: 07/04/22  1842      History   Chief Complaint No chief complaint on file.   HPI Ruth Dixon is a 52 y.o. female who presents with onset of cough, wheezing and fatigue x 5 days. Has been taking OTC meds with no relief.  She used her neb last night, and last dose of her albuterol inhaler at 5 pm. Her cough is productive with cream color mucous. She is a smoker and has asthma and been very stressed   Past Medical History:  Diagnosis Date   Asthma    Hypertension    IBS (irritable bowel syndrome)    Pleurisy    multiple times   Reflux     Patient Active Problem List   Diagnosis Date Noted   Rectal bleeding 01/07/2018   Abdominal pain 01/06/2018   Dysphagia 01/06/2018   Nausea with vomiting 01/06/2018   GERD (gastroesophageal reflux disease) 01/06/2018    Past Surgical History:  Procedure Laterality Date   BIOPSY  01/21/2018   Procedure: BIOPSY;  Surgeon: Corbin Ade, MD;  Location: [facility];  Service: Endoscopy;;  esopahgus/GE junction   COLONOSCOPY WITH PROPOFOL N/A 01/21/2018   Procedure: COLONOSCOPY WITH PROPOFOL;  Surgeon: Corbin Ade, MD;  [facility]: [facility];  Service: Endoscopy;  Laterality: N/A;  12:45pm   ESOPHAGOGASTRODUODENOSCOPY (EGD) WITH PROPOFOL N/A 01/21/2018   Procedure: ESOPHAGOGASTRODUODENOSCOPY (EGD) WITH PROPOFOL;  Surgeon: Corbin Ade, MD;  Location: [facility];  Service: Endoscopy;  Laterality: N/A;   HERNIA REPAIR     MALONEY DILATION N/A 01/21/2018   Procedure: Elease Hashimoto DILATION;  Surgeon: Corbin Ade, MD;  Location: [facility];  Service: Endoscopy;  Laterality: N/A;    OB History   No obstetric history on file.      Home Medications    Prior to Admission medications   Medication Sig Start Date End Date Taking? Authorizing Provider  doxycycline (VIBRAMYCIN) 100 MG capsule Take 1 capsule (100 mg total) by mouth 2 (two) times daily.  07/04/22  Yes Rodriguez-Southworth, Nettie Elm, PA-C  promethazine-phenylephrine 6.25-5 MG/5ML SYRP Take 5 mLs by mouth at bedtime as needed for congestion. 07/04/22  Yes Rodriguez-Southworth, Nettie Elm, PA-C  albuterol (PROVENTIL) (2.5 MG/3ML) 0.083% nebulizer solution Take 3 mLs (2.5 mg total) by nebulization every 4 (four) hours as needed for wheezing or shortness of breath. 07/04/22   Rodriguez-Southworth, Nettie Elm, PA-C  ALBUTEROL IN Inhale into the lungs.    [provider]  busPIRone (BUSPAR) 10 MG tablet Take 10 mg by mouth 3 (three) times daily.    [provider]  furosemide (LASIX) 20 MG tablet Take 40 mg by mouth daily.    [provider]  HYDROcodone-acetaminophen (NORCO) 10-325 MG tablet Take 1 tablet by mouth every 4 (four) hours as needed. 11/22/21   [provider]  ibuprofen (ADVIL) 600 MG tablet Take 1 tablet (600 mg total) by mouth 4 (four) times daily. 10/09/18   Ivery Quale, PA-C  promethazine-dextromethorphan (PROMETHAZINE-DM) 6.25-15 MG/5ML syrup Take 5 mLs by mouth 4 (four) times daily as needed for cough. 02/25/22   Particia Nearing, PA-C    Family History Family History  Problem Relation Age of Onset   Congestive Heart Failure Mother    Diabetes Maternal Grandmother    COPD Maternal Grandmother    Stroke Maternal Grandmother    COPD Maternal Grandfather    COPD Paternal Grandmother  COPD Paternal Grandfather    Colon cancer Neg Hx     Social History Social History   Tobacco Use   Smoking status: Every Day    Packs/day: 1    Types: Cigarettes   Smokeless tobacco: Never  Vaping Use   Vaping Use: Never used  Substance Use Topics   Alcohol use: Not Currently    Comment: 2 beers every night   Drug use: No     Allergies   Patient has no known allergies.   Review of Systems Review of Systems As noted in HPI  Physical Exam Triage Vital Signs ED Triage Vitals  Enc Vitals Group     BP 07/04/22 1912 118/75     Pulse  Rate 07/04/22 1912 80     Resp 07/04/22 1912 20     Temp 07/04/22 1912 97.9 F (36.6 C)     Temp Source 07/04/22 1912 Oral     SpO2 07/04/22 1912 95 %     Weight --      Height --      Head Circumference --      Peak Flow --      Pain Score 07/04/22 1917 0     Pain Loc --      Pain Edu? --      Excl. in GC? --    No data found.  Updated Vital Signs BP 118/75 (BP Location: Right Arm)   Pulse 80   Temp 97.9 F (36.6 C) (Oral)   Resp 20   LMP 02/21/2017 (Within Weeks)   SpO2 95%   Visual Acuity Right Eye Distance:   Left Eye Distance:   Bilateral Distance:    Right Eye Near:   Left Eye Near:    Bilateral Near:     Physical Exam Physical Exam Constitutional:      General: He is not in acute distress.    Appearance: He is not toxic-appearing.  HENT:     Head: Normocephalic.     Right Ear: Tympanic membrane, ear canal and external ear normal.     Left Ear: Ear canal and external ear normal.     Nose: Nose normal.     Mouth/Throat:     Mouth: Mucous membranes are moist.     Pharynx: Oropharynx is clear.  Eyes:     General: No scleral icterus.    Conjunctiva/sclera: Conjunctivae normal.  Cardiovascular:     Rate and Rhythm: Normal rate and regular rhythm.     Heart sounds: No murmur heard.   Pulmonary:     Effort: Pulmonary effort is normal. No respiratory distress.     Breath sounds: Wheezing present.     Comments: Has auditory wheezing Musculoskeletal:        General: Normal range of motion.     Cervical back: Neck supple.  Lymphadenopathy:     Cervical: No cervical adenopathy.  Skin:    General: Skin is warm and dry.     Findings: No rash.  Neurological:     Mental Status: He is alert and oriented to person, place, and time.     Gait: Gait normal.  Psychiatric:        Mood and Affect: Mood normal.        Behavior: Behavior normal.        Thought Content: Thought content normal.        Judgment: Judgment normal.    UC Treatments / Results   Labs (all labs ordered are  listed, but only abnormal results are displayed) Labs Reviewed - No data to display  EKG   Radiology No results found.  Procedures Procedures (including critical care time)  Medications Ordered in UC Medications  methylPREDNISolone sodium succinate (SOLU-MEDROL) 125 mg/2 mL injection 60 mg (60 mg Intramuscular Given 07/04/22 1947)    Initial Impression / Assessment and Plan / UC Course  I have reviewed the triage vital signs and the nursing notes. She declined a neb treatment here She was given Solumedrol 60 mg IM I placed her on Doxy. Phenergan VC, and I refilled her albuterol inhaler. See instructions.     Final Clinical Impressions(s) / UC Diagnoses   Final diagnoses:  Moderate persistent asthma with acute exacerbation     Discharge Instructions      Use your nebulizer at home every 4 - 6 hours for 3 days, then as needed after that      ED Prescriptions     Medication Sig Dispense Auth. Provider   albuterol (PROVENTIL) (2.5 MG/3ML) 0.083% nebulizer solution Take 3 mLs (2.5 mg total) by nebulization every 4 (four) hours as needed for wheezing or shortness of breath. 75 mL Rodriguez-Southworth, Nettie Elm, PA-C   doxycycline (VIBRAMYCIN) 100 MG capsule Take 1 capsule (100 mg total) by mouth 2 (two) times daily. 20 capsule Rodriguez-Southworth, Nettie Elm, PA-C   promethazine-phenylephrine 6.25-5 MG/5ML SYRP Take 5 mLs by mouth at bedtime as needed for congestion. 120 mL Rodriguez-Southworth, Nettie Elm, PA-C      I have reviewed the PDMP during this encounter.   Garey Ham, Cordelia Poche 07/04/22 2003

## 2022-07-04 NOTE — Discharge Instructions (Addendum)
Use your nebulizer at home every 4 - 6 hours for 3 days, then as needed after that

## 2022-07-04 NOTE — ED Triage Notes (Signed)
Pt reports she has a cough, wheezing, and is fatigue x 5 days. Took tylenol , ibuprofen, and cough meds but no relief.

## 2022-11-09 ENCOUNTER — Other Ambulatory Visit: Payer: Self-pay

## 2022-11-09 ENCOUNTER — Encounter: Payer: Self-pay | Admitting: Emergency Medicine

## 2022-11-09 ENCOUNTER — Ambulatory Visit
Admission: EM | Admit: 2022-11-09 | Discharge: 2022-11-09 | Disposition: A | Discharge status: Home / Self Care | Payer: 59 | Attending: Family Medicine | Admitting: Family Medicine

## 2022-11-09 DIAGNOSIS — J4551 Severe persistent asthma with (acute) exacerbation: Secondary | ICD-10-CM

## 2022-11-09 MED ORDER — IPRATROPIUM-ALBUTEROL 0.5-2.5 (3) MG/3ML IN SOLN
3.0000 mL | Freq: Once | RESPIRATORY_TRACT | Status: AC
  Administered 2022-11-09: 3 mL via RESPIRATORY_TRACT

## 2022-11-09 MED ORDER — DOXYCYCLINE HYCLATE 100 MG PO CAPS: 100.0000 mg | ORAL_CAPSULE | Freq: Two times a day (BID) | ORAL | 0 refills | Status: DC

## 2022-11-09 MED ORDER — IPRATROPIUM-ALBUTEROL 0.5-2.5 (3) MG/3ML IN SOLN: 3.0000 mL | RESPIRATORY_TRACT | 0 refills | Status: DC | PRN

## 2022-11-09 MED ORDER — DEXAMETHASONE SODIUM PHOSPHATE 10 MG/ML IJ SOLN
10.0000 mg | Freq: Once | INTRAMUSCULAR | Status: AC
  Administered 2022-11-09: 10 mg via INTRAMUSCULAR

## 2022-11-09 MED ORDER — PROMETHAZINE-DM 6.25-15 MG/5ML PO SYRP: 5.0000 mL | ORAL_SOLUTION | Freq: Four times a day (QID) | ORAL | 0 refills | Status: DC | PRN

## 2022-11-09 MED ORDER — PREDNISONE 10 MG PO TABS: ORAL_TABLET | ORAL | 0 refills | Status: DC

## 2022-11-09 MED ORDER — ALBUTEROL SULFATE HFA 108 (90 BASE) MCG/ACT IN AERS: 2.0000 | INHALATION_SPRAY | RESPIRATORY_TRACT | 0 refills | Status: DC | PRN

## 2022-11-09 NOTE — ED Triage Notes (Signed)
Pt reports hx of shortness of breath and bronchitis. Pt reports increased work of breathing even with home nebs. Pt reports is out of px inhaler but reports tried otc. Pt reports pain with deep breath.  Pt tripoding in triage, pursed lip breathing.

## 2022-11-09 NOTE — ED Provider Notes (Signed)
[facility]    CSN: [id number] Arrival date & time: 11/09/22  1245      History   Chief Complaint Chief Complaint  Patient presents with   Shortness of Breath    HPI Ruth Dixon is a 52 y.o. female.   Patient presenting today with several day history of progressively worsening shortness of breath, cough, chest tightness, wheezing, fatigue, labored breathing.  She denies fever, chest pain, abdominal pain, nausea vomiting or diarrhea.  Has been doing breathing treatments several times daily, ran out of her albuterol inhaler so got an over-the-counter mist inhaler which has not been helping.  History of significant asthma with frequent exacerbations though not on any chronic maintenance inhalers.    Past Medical History:  Diagnosis Date   Asthma    Hypertension    IBS (irritable bowel syndrome)    Pleurisy    multiple times   Reflux     Patient Active Problem List   Diagnosis Date Noted   Rectal bleeding 01/07/2018   Abdominal pain 01/06/2018   Dysphagia 01/06/2018   Nausea with vomiting 01/06/2018   GERD (gastroesophageal reflux disease) 01/06/2018    Past Surgical History:  Procedure Laterality Date   BIOPSY  01/21/2018   Procedure: BIOPSY;  Surgeon: Corbin Ade, MD;  Location: [facility];  Service: Endoscopy;;  [place]/GE junction   COLONOSCOPY WITH PROPOFOL N/A 01/21/2018   Procedure: COLONOSCOPY WITH PROPOFOL;  Surgeon: Corbin Ade, MD;  Location: [facility];  Service: Endoscopy;  Laterality: N/A;  12:45pm   ESOPHAGOGASTRODUODENOSCOPY (EGD) WITH PROPOFOL N/A 01/21/2018   Procedure: ESOPHAGOGASTRODUODENOSCOPY (EGD) WITH PROPOFOL;  Surgeon: Corbin Ade, MD;  Location: [facility];  Service: Endoscopy;  Laterality: N/A;   HERNIA REPAIR     MALONEY DILATION N/A 01/21/2018   Procedure: Elease Hashimoto DILATION;  Surgeon: Corbin Ade, MD;  Location: [facility];  Service: Endoscopy;  Laterality: N/A;    OB History   No  obstetric history on file.      Home Medications    Prior to Admission medications   Medication Sig Start Date End Date Taking? Authorizing Provider  albuterol (VENTOLIN HFA) 108 (90 Base) MCG/ACT inhaler Inhale 2 puffs into the lungs every 4 (four) hours as needed. 11/09/22  Yes Particia Nearing, PA-C  ipratropium-albuterol (DUONEB) 0.5-2.5 (3) MG/3ML SOLN Take 3 mLs by nebulization every 4 (four) hours as needed. 11/09/22  Yes Particia Nearing, PA-C  predniSONE (DELTASONE) 10 MG tablet Take 6 tabs day one, 5 tabs day two, 4 tabs day three, etc 11/09/22  Yes Particia Nearing, PA-C  albuterol (PROVENTIL) (2.5 MG/3ML) 0.083% nebulizer solution Take 3 mLs (2.5 mg total) by nebulization every 4 (four) hours as needed for wheezing or shortness of breath. 07/04/22   Rodriguez-Southworth, Nettie Elm, PA-C  ALBUTEROL IN Inhale into the lungs.    [provider]  busPIRone (BUSPAR) 10 MG tablet Take 10 mg by mouth 3 (three) times daily.    [provider]  doxycycline (VIBRAMYCIN) 100 MG capsule Take 1 capsule (100 mg total) by mouth 2 (two) times daily. 11/09/22   Particia Nearing, PA-C  furosemide (LASIX) 20 MG tablet Take 40 mg by mouth daily.    [provider]  HYDROcodone-acetaminophen (NORCO) 10-325 MG tablet Take 1 tablet by mouth every 4 (four) hours as needed. 11/22/21   [provider]  ibuprofen (ADVIL) 600 MG tablet Take 1 tablet (600 mg total) by mouth 4 (four)  times daily. 10/09/18   Ivery Quale, PA-C  promethazine-dextromethorphan (PROMETHAZINE-DM) 6.25-15 MG/5ML syrup Take 5 mLs by mouth 4 (four) times daily as needed for cough. 11/09/22   Particia Nearing, PA-C  promethazine-phenylephrine 6.25-5 MG/5ML SYRP Take 5 mLs by mouth at bedtime as needed for congestion. 07/04/22   Rodriguez-Southworth, Nettie Elm, PA-C    Family History Family History  Problem Relation Age of Onset   Congestive Heart Failure Mother    Diabetes Maternal  Grandmother    COPD Maternal Grandmother    Stroke Maternal Grandmother    COPD Maternal Grandfather    COPD Paternal Grandmother    COPD Paternal Grandfather    Colon cancer Neg Hx     Social History Social History   Tobacco Use   Smoking status: Every Day    Current packs/day: 1.00    Types: Cigarettes   Smokeless tobacco: Never  Vaping Use   Vaping status: Never Used  Substance Use Topics   Alcohol use: Not Currently    Comment: 2 beers every night   Drug use: No     Allergies   Patient has no known allergies.   Review of Systems Review of Systems PER HPI  Physical Exam Triage Vital Signs ED Triage Vitals [11/09/22 1255]  Encounter Vitals Group     BP 137/79     Systolic BP Percentile      Diastolic BP Percentile      Pulse Rate (!) 124     Resp (!) 24     Temp 98.8 F (37.1 C)     Temp Source Temporal     SpO2 93 %     Weight      Height      Head Circumference      Peak Flow      Pain Score 5     Pain Loc      Pain Education      Exclude from Growth Chart    No data found.  Updated Vital Signs BP 137/79 (BP Location: Right Arm)   Pulse (!) 124   Temp 98.8 F (37.1 C) (Temporal)   Resp (!) 24   LMP 02/21/2017 (Within Weeks)   SpO2 93%   Visual Acuity Right Eye Distance:   Left Eye Distance:   Bilateral Distance:    Right Eye Near:   Left Eye Near:    Bilateral Near:     Physical Exam Vitals and nursing note reviewed.  Constitutional:      Appearance: Normal appearance.  HENT:     Head: Atraumatic.     Right Ear: Tympanic membrane and external ear normal.     Left Ear: Tympanic membrane and external ear normal.     Nose: Nose normal.     Mouth/Throat:     Mouth: Mucous membranes are moist.     Pharynx: Posterior oropharyngeal erythema present.  Eyes:     Extraocular Movements: Extraocular movements intact.     Conjunctiva/sclera: Conjunctivae normal.  Cardiovascular:     Rate and Rhythm: Normal rate and regular rhythm.      Heart sounds: Normal heart sounds.  Pulmonary:     Effort: Respiratory distress present.     Breath sounds: Wheezing and rales present.  Musculoskeletal:        General: Normal range of motion.     Cervical back: Normal range of motion and neck supple.  Skin:    General: Skin is warm and dry.  Neurological:  Mental Status: She is alert and oriented to person, place, and time.     Motor: No weakness.     Gait: Gait normal.  Psychiatric:        Mood and Affect: Mood normal.        Thought Content: Thought content normal.    UC Treatments / Results  Labs (all labs ordered are listed, but only abnormal results are displayed) Labs Reviewed - No data to display  EKG  Radiology No results found.  Procedures Procedures (including critical care time)  Medications Ordered in UC Medications  ipratropium-albuterol (DUONEB) 0.5-2.5 (3) MG/3ML nebulizer solution 3 mL (3 mLs Nebulization Given 11/09/22 1301)  dexamethasone (DECADRON) injection 10 mg (10 mg Intramuscular Given 11/09/22 1335)    Initial Impression / Assessment and Plan / UC Course  I have reviewed the triage vital signs and the nursing notes.  Pertinent labs & imaging results that were available during my care of the patient were reviewed by me and considered in my medical decision making (see chart for details).     Tachycardic, tachypneic and borderline oxygen saturations in triage.  Upon entry to the clinic she does appear in respiratory distress with labored breathing, tripoding, accessory muscle use.  She is able to speak in full sentences and feeling symptomatically improved after a DuoNeb treatment but still with significant wheezes, crackles.  Will treat with doxycycline, IM Decadron, prednisone taper, Phenergan DM and refill albuterol inhaler and DuoNeb solution.  Discussed supportive over-the-counter medications, home care and strict return precautions.  Work note given. Final Clinical Impressions(s) / UC  Diagnoses   Final diagnoses:  Severe persistent asthma with acute exacerbation   Discharge Instructions   None    ED Prescriptions     Medication Sig Dispense Auth. Provider   doxycycline (VIBRAMYCIN) 100 MG capsule Take 1 capsule (100 mg total) by mouth 2 (two) times daily. 14 capsule Particia Nearing, [place]   promethazine-dextromethorphan (PROMETHAZINE-DM) 6.25-15 MG/5ML syrup Take 5 mLs by mouth 4 (four) times daily as needed for cough. 118 mL Particia Nearing, PA-C   predniSONE (DELTASONE) 10 MG tablet Take 6 tabs day one, 5 tabs day two, 4 tabs day three, etc 21 tablet Particia Nearing, PA-C   albuterol (VENTOLIN HFA) 108 (90 Base) MCG/ACT inhaler Inhale 2 puffs into the lungs every 4 (four) hours as needed. 18 g Particia Nearing, [place]   ipratropium-albuterol (DUONEB) 0.5-2.5 (3) MG/3ML SOLN Take 3 mLs by nebulization every 4 (four) hours as needed. 360 mL Particia Nearing, [place]      PDMP not reviewed this encounter.   Particia Nearing, [place] 11/09/22 1344

## 2022-11-13 ENCOUNTER — Emergency Department (HOSPITAL_COMMUNITY)
Admission: EM | Admit: 2022-11-13 | Discharge: 2022-11-13 | Discharge status: Against Medical Advice | Payer: 59 | Source: Home / Self Care | Attending: Emergency Medicine | Admitting: Emergency Medicine

## 2022-11-13 ENCOUNTER — Other Ambulatory Visit: Payer: Self-pay

## 2022-11-13 ENCOUNTER — Emergency Department (HOSPITAL_COMMUNITY): Payer: 59

## 2022-11-13 ENCOUNTER — Encounter: Payer: Self-pay | Admitting: Emergency Medicine

## 2022-11-13 ENCOUNTER — Encounter (HOSPITAL_COMMUNITY): Payer: Self-pay

## 2022-11-13 ENCOUNTER — Ambulatory Visit
Admission: EM | Admit: 2022-11-13 | Discharge: 2022-11-13 | Discharge status: Against Medical Advice | Payer: No Typology Code available for payment source

## 2022-11-13 DIAGNOSIS — Z720 Tobacco use: Secondary | ICD-10-CM | POA: Insufficient documentation

## 2022-11-13 DIAGNOSIS — Z5329 Procedure and treatment not carried out because of patient's decision for other reasons: Secondary | ICD-10-CM | POA: Insufficient documentation

## 2022-11-13 DIAGNOSIS — Z20822 Contact with and (suspected) exposure to covid-19: Secondary | ICD-10-CM | POA: Insufficient documentation

## 2022-11-13 DIAGNOSIS — J45909 Unspecified asthma, uncomplicated: Secondary | ICD-10-CM | POA: Insufficient documentation

## 2022-11-13 DIAGNOSIS — R000 Tachycardia, unspecified: Secondary | ICD-10-CM | POA: Insufficient documentation

## 2022-11-13 DIAGNOSIS — J9601 Acute respiratory failure with hypoxia: Secondary | ICD-10-CM | POA: Insufficient documentation

## 2022-11-13 DIAGNOSIS — I10 Essential (primary) hypertension: Secondary | ICD-10-CM | POA: Insufficient documentation

## 2022-11-13 DIAGNOSIS — J449 Chronic obstructive pulmonary disease, unspecified: Secondary | ICD-10-CM | POA: Insufficient documentation

## 2022-11-13 DIAGNOSIS — R0602 Shortness of breath: Secondary | ICD-10-CM | POA: Diagnosis not present

## 2022-11-13 DIAGNOSIS — R0603 Acute respiratory distress: Secondary | ICD-10-CM

## 2022-11-13 DIAGNOSIS — R0682 Tachypnea, not elsewhere classified: Secondary | ICD-10-CM

## 2022-11-13 DIAGNOSIS — J441 Chronic obstructive pulmonary disease with (acute) exacerbation: Secondary | ICD-10-CM | POA: Diagnosis not present

## 2022-11-13 LAB — BASIC METABOLIC PANEL
Anion gap: 9 (ref 5–15)
BUN: 10 mg/dL (ref 6–20)
CO2: 27 mmol/L (ref 22–32)
Calcium: 8.6 mg/dL — ABNORMAL LOW (ref 8.9–10.3)
Chloride: 98 mmol/L (ref 98–111)
Creatinine, Ser: 0.38 mg/dL — ABNORMAL LOW (ref 0.44–1.00)
GFR, Estimated: >60 mL/min (ref >60)
Glucose, Bld: 101 mg/dL — ABNORMAL HIGH (ref 70–99)
Potassium: 3.8 mmol/L (ref 3.5–5.1)
Sodium: 134 mmol/L — ABNORMAL LOW (ref 135–145)

## 2022-11-13 LAB — BLOOD GAS, VENOUS
Acid-Base Excess: 9.1 mmol/L — ABNORMAL HIGH (ref 0.0–2.0)
Bicarbonate: 34.2 mmol/L — ABNORMAL HIGH (ref 20.0–28.0)
Drawn by: 69488
O2 Saturation: 82.1 %
Patient temperature: 36.8
pCO2, Ven: 47 mm[Hg] (ref 44–60)
pH, Ven: 7.47 — ABNORMAL HIGH (ref 7.25–7.43)
pO2, Ven: 45 mm[Hg] (ref 32–45)

## 2022-11-13 LAB — CBC WITH DIFFERENTIAL/PLATELET
Abs Immature Granulocytes: 0.03 10*3/uL (ref 0.00–0.07)
Basophils Absolute: 0 10*3/uL (ref 0.0–0.1)
Basophils Relative: 0 %
Eosinophils Absolute: 0 10*3/uL (ref 0.0–0.5)
Eosinophils Relative: 0 %
HCT: 43.2 % (ref 36.0–46.0)
Hemoglobin: 14.1 g/dL (ref 12.0–15.0)
Immature Granulocytes: 0 %
Lymphocytes Relative: 30 %
Lymphs Abs: 2.5 10*3/uL (ref 0.7–4.0)
MCH: 27.2 pg (ref 26.0–34.0)
MCHC: 32.6 g/dL (ref 30.0–36.0)
MCV: 83.4 fL (ref 80.0–100.0)
Monocytes Absolute: 0.6 10*3/uL (ref 0.1–1.0)
Monocytes Relative: 7 %
Neutro Abs: 5.3 10*3/uL (ref 1.7–7.7)
Neutrophils Relative %: 63 %
Platelets: 404 10*3/uL — ABNORMAL HIGH (ref 150–400)
RBC: 5.18 MIL/uL — ABNORMAL HIGH (ref 3.87–5.11)
RDW: 14.2 % (ref 11.5–15.5)
WBC: 8.4 10*3/uL (ref 4.0–10.5)
nRBC: 0 % (ref 0.0–0.2)

## 2022-11-13 LAB — RESP PANEL BY RT-PCR (RSV, FLU A&B, COVID)  RVPGX2
Influenza A by PCR: NEGATIVE
Influenza B by PCR: NEGATIVE
Resp Syncytial Virus by PCR: NEGATIVE
SARS Coronavirus 2 by RT PCR: NEGATIVE

## 2022-11-13 MED ORDER — METHYLPREDNISOLONE SODIUM SUCC 125 MG IJ SOLR
125.0000 mg | Freq: Once | INTRAMUSCULAR | Status: AC
  Administered 2022-11-13: 125 mg via INTRAVENOUS
  Filled 2022-11-13: qty 2

## 2022-11-13 MED ORDER — ALBUTEROL SULFATE (2.5 MG/3ML) 0.083% IN NEBU
10.0000 mg/h | INHALATION_SOLUTION | Freq: Once | RESPIRATORY_TRACT | Status: AC
  Administered 2022-11-13: 10 mg/h via RESPIRATORY_TRACT

## 2022-11-13 MED ORDER — MAGNESIUM SULFATE 2 GM/50ML IV SOLN
2.0000 g | Freq: Once | INTRAVENOUS | Status: AC
  Administered 2022-11-13: 2 g via INTRAVENOUS
  Filled 2022-11-13: qty 50

## 2022-11-13 MED ORDER — ALBUTEROL SULFATE (2.5 MG/3ML) 0.083% IN NEBU
INHALATION_SOLUTION | RESPIRATORY_TRACT | Status: AC
  Filled 2022-11-13: qty 12

## 2022-11-13 NOTE — ED Provider Notes (Signed)
[place] [facility] AT [facility] Provider Note   CSN: [id number] Arrival date & time: 11/13/22  1637     History  Chief Complaint  Patient presents with   Cough    Ruth Dixon is a 52 y.o. female.  The history is provided by the patient and medical records. No language interpreter was used.  Cough    52 year old female with significant history of asthma/COPD, chronic tobacco use, hypertension, presenting with complaints of shortness of breath.  Patient here accompanied by mother with same presentation.  Patient mention for the past 4 days she has had increased wheezing, productive cough, trouble breathing, and generalized fatigue.  She does not endorse fever but does endorse chills.  No nausea vomiting or diarrhea.  She tries taking home medication without relief.  She mention she was seen by her PCP several days ago for same presentation and she had COVID test which came back negative.  She was prescribed antibiotic, steroids, and did receive a prednisone shot.  She is taking medication with improvement however symptoms still ongoing.  She admits she smoked nearly three-quarter of a pack a day and is actively trying to quit.  No prior she of PE or DVT.  She has been hospitalized for her asthma exacerbation in the past but states she usually leaves AMA.  She voiced no desire to stay in the hospital today.  Home Medications Prior to Admission medications   Medication Sig Start Date End Date Taking? Authorizing Provider  albuterol (PROVENTIL) (2.5 MG/3ML) 0.083% nebulizer solution Take 3 mLs (2.5 mg total) by nebulization every 4 (four) hours as needed for wheezing or shortness of breath. 07/04/22   Rodriguez-Southworth, Nettie Elm, PA-C  albuterol (VENTOLIN HFA) 108 (90 Base) MCG/ACT inhaler Inhale 2 puffs into the lungs every 4 (four) hours as needed. 11/09/22   Particia Nearing, PA-C  ALBUTEROL IN Inhale into the lungs.    [provider]   busPIRone (BUSPAR) 10 MG tablet Take 10 mg by mouth 3 (three) times daily.    [provider]  doxycycline (VIBRAMYCIN) 100 MG capsule Take 1 capsule (100 mg total) by mouth 2 (two) times daily. 11/09/22   Particia Nearing, PA-C  furosemide (LASIX) 20 MG tablet Take 40 mg by mouth daily.    [provider]  HYDROcodone-acetaminophen (NORCO) 10-325 MG tablet Take 1 tablet by mouth every 4 (four) hours as needed. 11/22/21   [provider]  ibuprofen (ADVIL) 600 MG tablet Take 1 tablet (600 mg total) by mouth 4 (four) times daily. 10/09/18   Ivery Quale, PA-C  ipratropium-albuterol (DUONEB) 0.5-2.5 (3) MG/3ML SOLN Take 3 mLs by nebulization every 4 (four) hours as needed. 11/09/22   Particia Nearing, PA-C  predniSONE (DELTASONE) 10 MG tablet Take 6 tabs day one, 5 tabs day two, 4 tabs day three, etc 11/09/22   Particia Nearing, PA-C  promethazine-dextromethorphan (PROMETHAZINE-DM) 6.25-15 MG/5ML syrup Take 5 mLs by mouth 4 (four) times daily as needed for cough. 11/09/22   Particia Nearing, PA-C  promethazine-phenylephrine 6.25-5 MG/5ML SYRP Take 5 mLs by mouth at bedtime as needed for congestion. 07/04/22   Rodriguez-Southworth, Nettie Elm, PA-C      Allergies    Patient has no known allergies.    Review of Systems   Review of Systems  Respiratory:  Positive for cough.   All other systems reviewed and are negative.   Physical Exam Updated Vital Signs BP 120/88   Pulse 96  Temp 98.2 F (36.8 C) (Oral)   Ht 5\' 7"  (1.702 m)   Wt 72.1 kg   LMP 02/21/2017 (Within Weeks)   SpO2 91%   BMI 24.90 kg/m  Physical Exam Vitals and nursing note reviewed.  Constitutional:      General: She is in acute distress.     Appearance: She is well-developed.     Comments: Patient in moderate respiratory discomfort however not tripoding at this time.  HENT:     Head: Atraumatic.  Eyes:     Conjunctiva/sclera: Conjunctivae normal.  Cardiovascular:      Rate and Rhythm: Tachycardia present.     Pulses: Normal pulses.     Heart sounds: Normal heart sounds.  Pulmonary:     Effort: Pulmonary effort is normal.     Breath sounds: Wheezing and rhonchi present. No rales.  Chest:     Chest wall: No tenderness.  Abdominal:     Palpations: Abdomen is soft.  Musculoskeletal:     Cervical back: Neck supple.  Skin:    Findings: No rash.  Neurological:     Mental Status: She is alert and oriented to person, place, and time.  Psychiatric:        Mood and Affect: Mood normal.     ED Results / Procedures / Treatments   Labs (all labs ordered are listed, but only abnormal results are displayed) Labs Reviewed  BASIC METABOLIC PANEL - Abnormal; Notable for the following components:      Result Value   Sodium 134 (*)    Glucose, Bld 101 (*)    Creatinine, Ser 0.38 (*)    Calcium 8.6 (*)    All other components within normal limits  CBC WITH DIFFERENTIAL/PLATELET - Abnormal; Notable for the following components:   RBC 5.18 (*)    Platelets 404 (*)    All other components within normal limits  BLOOD GAS, VENOUS - Abnormal; Notable for the following components:   pH, Ven 7.47 (*)    Bicarbonate 34.2 (*)    Acid-Base Excess 9.1 (*)    All other components within normal limits  RESP PANEL BY RT-PCR (RSV, FLU A&B, COVID)  [id number]    EKG None  Radiology DG Chest 2 View  Result Date: 11/13/2022 CLINICAL DATA:  Shortness of breath and productive cough EXAM: CHEST - 2 VIEW COMPARISON:  11/25/2021 FINDINGS: The heart size and mediastinal contours are within normal limits. Hyperinflation and coarsened interstitial markings similar to prior. Both lungs are clear. The visualized skeletal structures are unremarkable. IMPRESSION: No active cardiopulmonary disease. Electronically Signed   By: Minerva Fester M.D.   On: 11/13/2022 19:21    Procedures .Critical Care  Performed by: Fayrene Helper, PA-C Authorized by: Fayrene Helper, PA-C   Critical care  provider statement:    Critical care time (minutes):  30   Critical care was time spent personally by me on the following activities:  Development of treatment plan with patient or surrogate, discussions with consultants, evaluation of patient's response to treatment, examination of patient, ordering and review of laboratory studies, ordering and review of radiographic studies, ordering and performing treatments and interventions, pulse oximetry, re-evaluation of patient's condition and review of old charts     Medications Ordered in ED Medications  albuterol (PROVENTIL) (2.5 MG/3ML) 0.083% nebulizer solution (10 mg Nebulization Not Given 11/13/22 1801)  magnesium sulfate IVPB 2 g 50 mL (0 g Intravenous Stopped 11/13/22 1827)  methylPREDNISolone sodium succinate (SOLU-MEDROL) 125 mg/2 mL injection 125  mg (125 mg Intravenous Given 11/13/22 1757)    ED Course/ Medical Decision Making/ A&P                                 Medical Decision Making Amount and/or Complexity of Data Reviewed Labs: ordered. Radiology: ordered.  Risk Prescription drug management.   BP (!) 139/111 (BP Location: Right Arm)   Pulse 90   Temp 98.2 F (36.8 C) (Oral)   Resp (!) 28   Ht 5\' 7"  (1.702 m)   Wt 72.1 kg   LMP 02/21/2017 (Within Weeks)   SpO2 95%   BMI 24.90 kg/m   47:31 PM   52 year old female with significant history of asthma/COPD, chronic tobacco use, hypertension, presenting with complaints of shortness of breath.  Patient here accompanied by mother with same presentation.  Patient mention for the past 4 days she has had increased wheezing, productive cough, trouble breathing, and generalized fatigue.  She does not endorse fever but does endorse chills.  No nausea vomiting or diarrhea.  She tries taking home medication without relief.  She mention she was seen by her PCP several days ago for same presentation and she had COVID test which came back negative.  She was prescribed antibiotic,  steroids, and did receive a prednisone shot.  She is taking medication with improvement however symptoms still ongoing.  She admits she smoked nearly three-quarter of a pack a day and is actively trying to quit.  No prior she of PE or DVT.  She has been hospitalized for her asthma exacerbation in the past but states she usually leaves AMA.  She voiced no desire to stay in the hospital today.  on exam patient is wearing supplemental oxygen, in moderate respiratory discomfort however not tripoding at this time.  Heart with mild tachycardia, lungs with decreased breath sounds along with both inspiratory and expiratory wheezes and rhonchi heard.  She does not appear to be fluid overload.  Patient also denies having any environmental changes not having any space heater at home or any concerns for carbon monoxide poisoning.  Other family members sick with respiratory symptoms.  Patient initially was hypoxic with O2 sats at 84%, improved on 4 L of supplemental oxygen.  Will provide breathing treatment.  -Labs ordered, independently viewed and interpreted by me.  Labs remarkable for vbg showing pH of 7.47, covid/flu/rsv negative -The patient was maintained on a cardiac monitor.  I personally viewed and interpreted the cardiac monitored which showed an underlying rhythm of: sinus tachycardia -Imaging independently viewed and interpreted by me and I agree with radiologist's interpretation.  Result remarkable for CXR unremarkable -This patient presents to the ED for concern of sob, this involves an extensive number of treatment options, and is a complaint that carries with it a high risk of complications and morbidity.  The differential diagnosis includes asthma/copd exacerbation, pe, ptx, pna, reactive airway disease -Co morbidities that complicate the patient evaluation includes tobacco use, asthma -Treatment includes duonebs, continusous nabs, steroid, mags -Reevaluation of the patient after these medicines  showed that the patient improved -PCP office notes or outside notes reviewed -Escalation to admission/observation considered: pt request to be discharge, however O2 sats at 85% on RA.  Pt made aware she's not safe for discharge and I recommend hospital admission.  Pt refused.  She is able to make informed decision and understand the risk including worsening of sxs and/or death.  I encourage  return if sxs worsen.  Pt will leave AMA -Prescription medication considered, patient comfortable with home meds -Social Determinant of Health considered which includes tobacco use  Considered BiPAP, pt wants to leave        Final Clinical Impression(s) / ED Diagnoses Final diagnoses:  Acute respiratory failure with hypoxia [facility])    Rx / DC Orders ED Discharge Orders     None         Fayrene Helper, PA-C 11/13/22 1928    Terrilee Files, MD 11/14/22 1021

## 2022-11-13 NOTE — ED Triage Notes (Signed)
Pt reports productive cough since Sunday, received a shot of decadron at PCP office the other day and felt better for a little while and when she asked PCP for another shot they told her to come to the ER.

## 2022-11-13 NOTE — ED Notes (Signed)
Called RT and informed of O2 sats of 87-90% Informed provider requested nebs.

## 2022-11-13 NOTE — ED Triage Notes (Signed)
Pt reports was seen for similar over weekend and reports mild improvement but reports "it feels tight to breathe again."

## 2022-11-13 NOTE — Discharge Instructions (Signed)
Please go directly to the ER for further evaluation and management of your symptoms 

## 2022-11-13 NOTE — ED Provider Notes (Signed)
Patient presents today for follow-up of her breathing.  Reports she was seen on 11/09/2022 and was started on prednisone, and antibiotic, and breathing treatments at home.  Reports symptoms are not much improved from when she was seen.  She has been giving her mother some of her prednisone and breathing treatments.    On exam, patient is in respiratory distress, breathing more than 20 times per minute, and cannot complete a sentence without gasping for breath.  She has audible wheezes.  She is tripoding with pursed lip breathing.  SpO2 on room air is 90%, patient is also tachycardic.  I recommended transportation to the ER via EMS, however patient declines reports she feels safe to take herself.  She understands the risks of driving herself to the ER without advanced monitoring.     Valentino Nose, NP 11/13/22 [phone]

## 2022-11-13 NOTE — ED Notes (Signed)
Pt O2 sats remained 84-85% RA Pt insisting on leaving  Informed provider Pt stated she has a sick dog to get home to and needed to pick up her mothers food Pt refused to stay Requested a work note until Monday Informed no work note for [employer].  Pt pursed lips breathing, pale in color.  Informed to call 911 if she feels like she can not breathe.

## 2022-11-13 NOTE — ED Notes (Signed)
Pt returned from CXR Mid to low 80's pulse ox Placed pt on 4LPM O2 via [place] [facility]

## 2022-11-13 NOTE — ED Notes (Signed)
PA at bedside.

## 2022-11-13 NOTE — ED Notes (Signed)
Breathing treatment completed Pt anxious and requesting discharge now.  Informed provider

## 2022-11-14 ENCOUNTER — Inpatient Hospital Stay (HOSPITAL_COMMUNITY)
Admission: EM | Admit: 2022-11-14 | Discharge: 2022-11-15 | DRG: 190 | Disposition: A | Discharge status: Home / Self Care | Payer: 59 | Attending: Internal Medicine | Admitting: Internal Medicine

## 2022-11-14 ENCOUNTER — Other Ambulatory Visit: Payer: Self-pay

## 2022-11-14 ENCOUNTER — Emergency Department (HOSPITAL_COMMUNITY): Payer: 59

## 2022-11-14 DIAGNOSIS — Z825 Family history of asthma and other chronic lower respiratory diseases: Secondary | ICD-10-CM

## 2022-11-14 DIAGNOSIS — K219 Gastro-esophageal reflux disease without esophagitis: Secondary | ICD-10-CM | POA: Diagnosis present

## 2022-11-14 DIAGNOSIS — R0602 Shortness of breath: Secondary | ICD-10-CM | POA: Diagnosis present

## 2022-11-14 DIAGNOSIS — F17200 Nicotine dependence, unspecified, uncomplicated: Secondary | ICD-10-CM

## 2022-11-14 DIAGNOSIS — F17210 Nicotine dependence, cigarettes, uncomplicated: Secondary | ICD-10-CM | POA: Diagnosis present

## 2022-11-14 DIAGNOSIS — Z8249 Family history of ischemic heart disease and other diseases of the circulatory system: Secondary | ICD-10-CM

## 2022-11-14 DIAGNOSIS — Z833 Family history of diabetes mellitus: Secondary | ICD-10-CM

## 2022-11-14 DIAGNOSIS — D72829 Elevated white blood cell count, unspecified: Secondary | ICD-10-CM | POA: Diagnosis present

## 2022-11-14 DIAGNOSIS — Z823 Family history of stroke: Secondary | ICD-10-CM

## 2022-11-14 DIAGNOSIS — K589 Irritable bowel syndrome without diarrhea: Secondary | ICD-10-CM | POA: Diagnosis present

## 2022-11-14 DIAGNOSIS — J441 Chronic obstructive pulmonary disease with (acute) exacerbation: Secondary | ICD-10-CM | POA: Diagnosis present

## 2022-11-14 DIAGNOSIS — Z79899 Other long term (current) drug therapy: Secondary | ICD-10-CM | POA: Diagnosis not present

## 2022-11-14 DIAGNOSIS — Z1152 Encounter for screening for COVID-19: Secondary | ICD-10-CM

## 2022-11-14 DIAGNOSIS — J9601 Acute respiratory failure with hypoxia: Principal | ICD-10-CM | POA: Diagnosis present

## 2022-11-14 DIAGNOSIS — F419 Anxiety disorder, unspecified: Secondary | ICD-10-CM

## 2022-11-14 DIAGNOSIS — G2581 Restless legs syndrome: Secondary | ICD-10-CM

## 2022-11-14 DIAGNOSIS — I10 Essential (primary) hypertension: Secondary | ICD-10-CM | POA: Diagnosis present

## 2022-11-14 LAB — COMPREHENSIVE METABOLIC PANEL
ALT: 21 U/L (ref 0–44)
AST: 18 U/L (ref 15–41)
Albumin: 4 g/dL (ref 3.5–5.0)
Alkaline Phosphatase: 47 U/L (ref 38–126)
Anion gap: 11 (ref 5–15)
BUN: 11 mg/dL (ref 6–20)
CO2: 27 mmol/L (ref 22–32)
Calcium: 9.1 mg/dL (ref 8.9–10.3)
Chloride: 97 mmol/L — ABNORMAL LOW (ref 98–111)
Creatinine, Ser: 0.49 mg/dL (ref 0.44–1.00)
GFR, Estimated: >60 mL/min (ref >60)
Glucose, Bld: 101 mg/dL — ABNORMAL HIGH (ref 70–99)
Potassium: 4.3 mmol/L (ref 3.5–5.1)
Sodium: 135 mmol/L (ref 135–145)
Total Bilirubin: 0.5 mg/dL (ref 0.3–1.2)
Total Protein: 7.5 g/dL (ref 6.5–8.1)

## 2022-11-14 LAB — CBC
HCT: 45.8 % (ref 36.0–46.0)
Hemoglobin: 14.4 g/dL (ref 12.0–15.0)
MCH: 26.8 pg (ref 26.0–34.0)
MCHC: 31.4 g/dL (ref 30.0–36.0)
MCV: 85.1 fL (ref 80.0–100.0)
Platelets: 443 10*3/uL — ABNORMAL HIGH (ref 150–400)
RBC: 5.38 MIL/uL — ABNORMAL HIGH (ref 3.87–5.11)
RDW: 14 % (ref 11.5–15.5)
WBC: 13.8 10*3/uL — ABNORMAL HIGH (ref 4.0–10.5)
nRBC: 0 % (ref 0.0–0.2)

## 2022-11-14 LAB — MAGNESIUM: Magnesium: 2.3 mg/dL (ref 1.7–2.4)

## 2022-11-14 LAB — BLOOD GAS, VENOUS
Acid-Base Excess: 9.1 mmol/L — ABNORMAL HIGH (ref 0.0–2.0)
Bicarbonate: 34.2 mmol/L — ABNORMAL HIGH (ref 20.0–28.0)
Drawn by: 4237
O2 Saturation: 83.2 %
Patient temperature: 37.3
pCO2, Ven: 48 mm[Hg] (ref 44–60)
pH, Ven: 7.47 — ABNORMAL HIGH (ref 7.25–7.43)
pO2, Ven: 48 mm[Hg] — ABNORMAL HIGH (ref 32–45)

## 2022-11-14 LAB — PROCALCITONIN: Procalcitonin: <0.1 ng/mL

## 2022-11-14 MED ORDER — ZOLPIDEM TARTRATE 5 MG PO TABS: 5.0000 mg | ORAL_TABLET | Freq: Every evening | ORAL | Status: DC | PRN

## 2022-11-14 MED ORDER — HEPARIN SODIUM (PORCINE) 5000 UNIT/ML IJ SOLN
5000.0000 [IU] | Freq: Three times a day (TID) | INTRAMUSCULAR | Status: DC
  Administered 2022-11-14 – 2022-11-15 (×2): 5000 [IU] via SUBCUTANEOUS
  Filled 2022-11-14 (×2): qty 1

## 2022-11-14 MED ORDER — ALBUTEROL SULFATE (2.5 MG/3ML) 0.083% IN NEBU
10.0000 mg/h | INHALATION_SOLUTION | Freq: Once | RESPIRATORY_TRACT | Status: AC
  Administered 2022-11-14: 10 mg/h via RESPIRATORY_TRACT
  Filled 2022-11-14: qty 12

## 2022-11-14 MED ORDER — ONDANSETRON HCL 4 MG PO TABS: 4.0000 mg | ORAL_TABLET | Freq: Four times a day (QID) | ORAL | Status: DC | PRN

## 2022-11-14 MED ORDER — ALBUTEROL SULFATE (2.5 MG/3ML) 0.083% IN NEBU
INHALATION_SOLUTION | RESPIRATORY_TRACT | Status: AC
  Filled 2022-11-14: qty 12

## 2022-11-14 MED ORDER — AZITHROMYCIN 250 MG PO TABS
500.0000 mg | ORAL_TABLET | Freq: Once | ORAL | Status: AC
  Administered 2022-11-14: 500 mg via ORAL
  Filled 2022-11-14: qty 2

## 2022-11-14 MED ORDER — NICOTINE 21 MG/24HR TD PT24: 21.0000 mg | MEDICATED_PATCH | Freq: Once | TRANSDERMAL | Status: DC

## 2022-11-14 MED ORDER — ALBUTEROL SULFATE (2.5 MG/3ML) 0.083% IN NEBU: 2.5000 mg | INHALATION_SOLUTION | RESPIRATORY_TRACT | Status: DC | PRN

## 2022-11-14 MED ORDER — IPRATROPIUM BROMIDE 0.02 % IN SOLN
0.5000 mg | Freq: Once | RESPIRATORY_TRACT | Status: AC
  Administered 2022-11-14: 0.5 mg via RESPIRATORY_TRACT
  Filled 2022-11-14: qty 2.5

## 2022-11-14 MED ORDER — ACETAMINOPHEN 325 MG PO TABS: 650.0000 mg | ORAL_TABLET | Freq: Four times a day (QID) | ORAL | Status: DC | PRN

## 2022-11-14 MED ORDER — ROPINIROLE HCL 0.25 MG PO TABS
0.5000 mg | ORAL_TABLET | Freq: Every day | ORAL | Status: DC
  Administered 2022-11-14: 0.5 mg via ORAL
  Filled 2022-11-14: qty 2

## 2022-11-14 MED ORDER — IPRATROPIUM-ALBUTEROL 0.5-2.5 (3) MG/3ML IN SOLN
3.0000 mL | Freq: Four times a day (QID) | RESPIRATORY_TRACT | Status: DC
  Administered 2022-11-15 (×2): 3 mL via RESPIRATORY_TRACT
  Filled 2022-11-14 (×2): qty 3

## 2022-11-14 MED ORDER — SODIUM CHLORIDE 0.9 % IV SOLN
1.0000 g | Freq: Once | INTRAVENOUS | Status: AC
  Administered 2022-11-14: 1 g via INTRAVENOUS
  Filled 2022-11-14: qty 10

## 2022-11-14 MED ORDER — IPRATROPIUM BROMIDE 0.02 % IN SOLN
RESPIRATORY_TRACT | Status: AC
  Filled 2022-11-14: qty 2.5

## 2022-11-14 MED ORDER — MAGNESIUM SULFATE 2 GM/50ML IV SOLN
2.0000 g | Freq: Once | INTRAVENOUS | Status: AC
  Administered 2022-11-14: 2 g via INTRAVENOUS
  Filled 2022-11-14: qty 50

## 2022-11-14 MED ORDER — METHYLPREDNISOLONE SODIUM SUCC 125 MG IJ SOLR
125.0000 mg | Freq: Once | INTRAMUSCULAR | Status: AC
  Administered 2022-11-14: 125 mg via INTRAVENOUS
  Filled 2022-11-14: qty 2

## 2022-11-14 MED ORDER — ONDANSETRON HCL 4 MG/2ML IJ SOLN: 4.0000 mg | Freq: Four times a day (QID) | INTRAMUSCULAR | Status: DC | PRN

## 2022-11-14 MED ORDER — MOMETASONE FURO-FORMOTEROL FUM 200-5 MCG/ACT IN AERO: 2.0000 | INHALATION_SPRAY | Freq: Two times a day (BID) | RESPIRATORY_TRACT | Status: DC

## 2022-11-14 MED ORDER — ACETAMINOPHEN 650 MG RE SUPP: 650.0000 mg | Freq: Four times a day (QID) | RECTAL | Status: DC | PRN

## 2022-11-14 MED ORDER — BUSPIRONE HCL 5 MG PO TABS
5.0000 mg | ORAL_TABLET | Freq: Three times a day (TID) | ORAL | Status: DC
  Administered 2022-11-14 – 2022-11-15 (×2): 5 mg via ORAL
  Filled 2022-11-14 (×2): qty 1

## 2022-11-14 MED ORDER — OXYCODONE HCL 5 MG PO TABS: 5.0000 mg | ORAL_TABLET | ORAL | Status: DC | PRN

## 2022-11-14 MED ORDER — PREDNISONE 20 MG PO TABS
40.0000 mg | ORAL_TABLET | Freq: Every day | ORAL | Status: DC
  Administered 2022-11-15: 40 mg via ORAL
  Filled 2022-11-14: qty 2

## 2022-11-14 NOTE — ED Triage Notes (Signed)
Seen yesterday in ED Left AMA with O2 sats 84% Pt has increased SOB   87% RA in triage Placed on O2

## 2022-11-14 NOTE — ED Provider Notes (Signed)
[place] EMERGENCY DEPARTMENT AT [facility] Provider Note   CSN: [id number] Arrival date & time: 11/14/22  1655     History  Chief Complaint  Patient presents with   Shortness of Breath    Ruth Dixon is a 52 y.o. female.  History of smoking.  States she has never been diagnosed with COPD but was told urgent care last year that she probably does have a due to her recurrent bouts of wheezing and shortness of breath.  Has been having about 5 or 6 days of cough, wheezing and shortness of breath.  States her mother has same symptoms at home.  Was seen in urgent care on 10/6 and prescribed steroids, doxycycline was not feeling better yesterday sent from urgent care for hypoxia.  She came into the ER and ultimately signed out AMA because she did not want to stay despite her hypoxia.  She comes back today due to continued symptoms.  States her cough is productive but she swallows the sputum so is not sure what color, denies fever.   Shortness of Breath      Home Medications Prior to Admission medications   Medication Sig Start Date End Date Taking? Authorizing Provider  albuterol (PROVENTIL) (2.5 MG/3ML) 0.083% nebulizer solution Take 3 mLs (2.5 mg total) by nebulization every 4 (four) hours as needed for wheezing or shortness of breath. 07/04/22  Yes Rodriguez-Southworth, Nettie Elm, PA-C  albuterol (VENTOLIN HFA) 108 (90 Base) MCG/ACT inhaler Inhale 2 puffs into the lungs every 4 (four) hours as needed. 11/09/22  Yes Particia Nearing, PA-C  busPIRone (BUSPAR) 10 MG tablet Take 5 mg by mouth 3 (three) times daily.   Yes [provider]  doxycycline (VIBRAMYCIN) 100 MG capsule Take 1 capsule (100 mg total) by mouth 2 (two) times daily. 11/09/22  Yes Particia Nearing, PA-C  furosemide (LASIX) 20 MG tablet Take 20 mg by mouth daily.   Yes [provider]  HYDROcodone-acetaminophen (NORCO) 10-325 MG tablet Take 1 tablet by mouth every 4 (four) hours as  needed. 11/22/21  Yes [provider]  Multiple Vitamin (MULTIVITAMIN) tablet Take 1 tablet by mouth daily.   Yes [provider]  predniSONE (DELTASONE) 10 MG tablet Take 6 tabs day one, 5 tabs day two, 4 tabs day three, etc 11/09/22  Yes Particia Nearing, PA-C  Pseudoephedrine-APAP-DM (THERAFLU-D PO) Take by mouth at bedtime.   Yes [provider]  rOPINIRole (REQUIP) 0.5 MG tablet Take 0.5 mg by mouth at bedtime.   Yes [provider]  zolpidem (AMBIEN) 10 MG tablet Take 10 mg by mouth at bedtime as needed. 11/07/22  Yes [provider]      Allergies    Patient has no known allergies.    Review of Systems   Review of Systems  Respiratory:  Positive for shortness of breath.     Physical Exam Updated Vital Signs BP 111/64   Pulse 93   Temp 98.2 F (36.8 C) (Oral)   Resp 16   Ht 5\' 8"  (1.727 m)   Wt 64.4 kg   LMP 02/21/2017 (Within Weeks)   SpO2 96%   BMI 21.59 kg/m  Physical Exam Vitals and nursing note reviewed.  Constitutional:      General: She is not in acute distress.    Appearance: She is well-developed.  HENT:     Head: Normocephalic and atraumatic.  Eyes:     Conjunctiva/sclera: Conjunctivae normal.  Cardiovascular:  Rate and Rhythm: Normal rate and regular rhythm.     Heart sounds: No murmur heard. Pulmonary:     Effort: Tachypnea and accessory muscle usage present. No respiratory distress.     Breath sounds: Normal breath sounds.     Comments: Diffuse wheezing and rales bilaterally Abdominal:     Palpations: Abdomen is soft.     Tenderness: There is no abdominal tenderness.  Musculoskeletal:        General: No swelling.     Cervical back: Neck supple.  Skin:    General: Skin is warm and dry.     Capillary Refill: Capillary refill takes less than 2 seconds.  Neurological:     General: No focal deficit present.     Mental Status: She is alert and oriented to person, place, and time.  Psychiatric:         Mood and Affect: Mood normal.     ED Results / Procedures / Treatments   Labs (all labs ordered are listed, but only abnormal results are displayed) Labs Reviewed  CBC - Abnormal; Notable for the following components:      Result Value   WBC 13.8 (*)    RBC 5.38 (*)    Platelets 443 (*)    All other components within normal limits  BLOOD GAS, VENOUS - Abnormal; Notable for the following components:   pH, Ven 7.47 (*)    pO2, Ven 48 (*)    Bicarbonate 34.2 (*)    Acid-Base Excess 9.1 (*)    All other components within normal limits  COMPREHENSIVE METABOLIC PANEL - Abnormal; Notable for the following components:   Chloride 97 (*)    Glucose, Bld 101 (*)    All other components within normal limits  MAGNESIUM  PROCALCITONIN    EKG None  Radiology DG Chest 2 View  Result Date: 11/13/2022 CLINICAL DATA:  Shortness of breath and productive cough EXAM: CHEST - 2 VIEW COMPARISON:  11/25/2021 FINDINGS: The heart size and mediastinal contours are within normal limits. Hyperinflation and coarsened interstitial markings similar to prior. Both lungs are clear. The visualized skeletal structures are unremarkable. IMPRESSION: No active cardiopulmonary disease. Electronically Signed   By: Minerva Fester M.D.   On: 11/13/2022 19:21    Procedures .Critical Care  Performed by: Ma Rings, PA-C Authorized by: Ma Rings, PA-C   Critical care provider statement:    Critical care time (minutes):  30   Critical care was necessary to treat or prevent imminent or life-threatening deterioration of the following conditions:  Respiratory failure   Critical care was time spent personally by me on the following activities:  Development of treatment plan with patient or surrogate, discussions with consultants, evaluation of patient's response to treatment, examination of patient, ordering and review of laboratory studies, ordering and review of radiographic studies, ordering and  performing treatments and interventions, pulse oximetry, re-evaluation of patient's condition, review of old charts and obtaining history from patient or surrogate   I assumed direction of critical care for this patient from another provider in my specialty: no     Care discussed with: admitting provider       Medications Ordered in ED Medications  magnesium sulfate IVPB 2 g 50 mL (2 g Intravenous New Bag/Given 11/14/22 1922)  nicotine (NICODERM CQ - dosed in mg/24 hours) patch 21 mg (has no administration in time range)  albuterol (PROVENTIL) (2.5 MG/3ML) 0.083% nebulizer solution (10 mg Nebulization Not Given 11/14/22 1900)  ipratropium (  ATROVENT) nebulizer solution 0.5 mg (0.5 mg Nebulization Not Given 11/14/22 1900)  methylPREDNISolone sodium succinate (SOLU-MEDROL) 125 mg/2 mL injection 125 mg (125 mg Intravenous Given 11/14/22 1838)  cefTRIAXone (ROCEPHIN) 1 g in sodium chloride 0.9 % 100 mL IVPB (0 g Intravenous Stopped 11/14/22 1919)  azithromycin (ZITHROMAX) tablet 500 mg (500 mg Oral Given 11/14/22 1919)    ED Course/ Medical Decision Making/ A&P                                 Medical Decision Making Ddx: Pneumonia, COPD exacerbation, CHF, PE, bronchitis, other  ED course: Patient presents with wheezing, shortness of breath and hypoxia, satting 87% on room air, on 2 L 94 to 96% on my evaluation, she has some mild tachypnea and accessory muscle usage with diffuse wheezing and Rales on exam.  She was seen last night and advised that she should be admitted for respiratory failure and patient refused to stay, she states she has had this happen before and went home with medications and felt much better but is still not feeling better.  Patient appears hydrated she is afebrile and able to speak in full sentences at this time.  Chest reveals normal, will give empiric antibiotics currently patient has been spitting her medications with her mother including her steroids and antibiotics, is  a smoker with presumptive COPD and having a productive cough.  Discussed with hospitalist for admission.    She is agreeable with admission today.  She does have a mild leukocytosis, got steroids yesterday though had been getting steroids at home and got 10 mg of dexamethasone on the 6 and was a count was normal yesterday.  Hospitalist consulted at this time for admission    Amount and/or Complexity of Data Reviewed External Data Reviewed: labs and notes. Labs: ordered.    Details: Leukocytosis 13.8, hemoglobin is normal, pH shows 7.47 on VBG, pO2 48, CMP overall reassuring, magnesium normal Radiology: ordered and independent interpretation performed.    Details: X-ray shows no pulmonary edema or infiltrate  Risk OTC drugs. Prescription drug management. Decision regarding hospitalization.           Final Clinical Impression(s) / ED Diagnoses Final diagnoses:  Acute respiratory failure with hypoxia (HCC)  COPD exacerbation [facility])    Rx / DC Orders ED Discharge Orders     None         Ma Rings, PA-C 11/14/22 1931    Vanetta Mulders, MD 11/15/22 2349

## 2022-11-14 NOTE — H&P (Signed)
History and Physical    Patient: Ruth Dixon HYQ:[id number] DOB: 14-Oct-1970 DOA: 11/14/2022 DOS: the patient was seen and examined on 11/14/2022 PCP: Elfredia Nevins, MD  Patient coming from: Home  Chief Complaint:  Chief Complaint  Patient presents with   Shortness of Breath   HPI: Ruth Dixon is a 52 y.o. female with medical history significant of tobacco use disorder, asthma/COPD, hypertension, GERD, restless leg, anxiety, presents to the ED with a chief complaint of dyspnea.  Patient reports that her spouse and her mother have both been ill.  She reports when she catches a respiratory illness it is much worse than it is with either of them because she has "bad lungs."  Patient reports that she went to urgent care on Sunday.  She was prescribed prednisone and antibiotic, cough medicine, inhaler, nebulizer, and given a shot of Solu-Medrol.  She reports on Sunday and Monday she was scared for her life.  She could barely make it from her bedroom to the living room.  She reports she was using her nebulizer 3 times a day.  She was using her inhaler couple times a day as well.  She reports she has a family member's oxygen in her garage, but she did not even think to use it.  Since her mother is also sick, the medications that she was prescribed urgent care she shared with her mom, so likely getting subtherapeutic doses.  Patient denies chest pain, lightheadedness.  She endorses weakness generally, wheeze, myalgias, fatigue, productive cough.  She denies fever.  Patient reports she is going to leave by 10 AM.  She wants to be discharged home, with oxygen if necessary.  She reports she has to take care of her mother and go back to work.  She reports "I am not staying until 1:00."  Patient has no other complaints at this time.  Patient does not smoke and does not drink.  Patient reports she is full code but would not want to be on long-term life support. Review of Systems: As mentioned in the  history of present illness. All other systems reviewed and are negative. Past Medical History:  Diagnosis Date   Asthma    Hypertension    IBS (irritable bowel syndrome)    Pleurisy    multiple times   Reflux    Past Surgical History:  Procedure Laterality Date   BIOPSY  01/21/2018   Procedure: BIOPSY;  Surgeon: Corbin Ade, MD;  Location: [facility];  Service: Endoscopy;;  esopahgus/GE junction   COLONOSCOPY WITH PROPOFOL N/A 01/21/2018   Procedure: COLONOSCOPY WITH PROPOFOL;  Surgeon: Corbin Ade, MD;  Location: [facility];  Service: Endoscopy;  Laterality: N/A;  12:45pm   ESOPHAGOGASTRODUODENOSCOPY (EGD) WITH PROPOFOL N/A 01/21/2018   Procedure: ESOPHAGOGASTRODUODENOSCOPY (EGD) WITH PROPOFOL;  Surgeon: Corbin Ade, MD;  Location: [facility];  Service: Endoscopy;  Laterality: N/A;   HERNIA REPAIR     MALONEY DILATION N/A 01/21/2018   Procedure: Elease Hashimoto DILATION;  Surgeon: Corbin Ade, MD;  Location: [facility];  Service: Endoscopy;  Laterality: N/A;   Social History:  reports that she has been smoking cigarettes. She has never used smokeless tobacco. She reports that she does not currently use alcohol. She reports that she does not use drugs.  No Known Allergies  Family History  Problem Relation Age of Onset   Congestive Heart Failure Mother    Diabetes Maternal Grandmother    COPD Maternal Grandmother  Stroke Maternal Grandmother    COPD Maternal Grandfather    COPD Paternal Grandmother    COPD Paternal Grandfather    Colon cancer Neg Hx     Prior to Admission medications   Medication Sig Start Date End Date Taking? Authorizing Provider  albuterol (PROVENTIL) (2.5 MG/3ML) 0.083% nebulizer solution Take 3 mLs (2.5 mg total) by nebulization every 4 (four) hours as needed for wheezing or shortness of breath. 07/04/22  Yes Rodriguez-Southworth, Nettie Elm, PA-C  albuterol (VENTOLIN HFA) 108 (90 Base) MCG/ACT inhaler Inhale 2 puffs into the lungs every  4 (four) hours as needed. 11/09/22  Yes Particia Nearing, PA-C  busPIRone (BUSPAR) 10 MG tablet Take 5 mg by mouth 3 (three) times daily.   Yes [provider]  doxycycline (VIBRAMYCIN) 100 MG capsule Take 1 capsule (100 mg total) by mouth 2 (two) times daily. 11/09/22  Yes Particia Nearing, PA-C  furosemide (LASIX) 20 MG tablet Take 20 mg by mouth daily.   Yes [provider]  HYDROcodone-acetaminophen (NORCO) 10-325 MG tablet Take 1 tablet by mouth every 4 (four) hours as needed. 11/22/21  Yes [provider]  Multiple Vitamin (MULTIVITAMIN) tablet Take 1 tablet by mouth daily.   Yes [provider]  predniSONE (DELTASONE) 10 MG tablet Take 6 tabs day one, 5 tabs day two, 4 tabs day three, etc 11/09/22  Yes Particia Nearing, PA-C  Pseudoephedrine-APAP-DM (THERAFLU-D PO) Take by mouth at bedtime.   Yes [provider]  rOPINIRole (REQUIP) 0.5 MG tablet Take 0.5 mg by mouth at bedtime.   Yes [provider]  zolpidem (AMBIEN) 10 MG tablet Take 10 mg by mouth at bedtime as needed. 11/07/22  Yes [provider]    Physical Exam: Vitals:   11/14/22 1850 11/14/22 1855 11/14/22 1930 11/14/22 2038  BP:   (!) 145/91 (!) 135/90  Pulse: 93  (!) 104 95  Resp: 16  (!) 24 20  Temp:   98.2 F (36.8 C) 98.3 F (36.8 C)  TempSrc:   Oral   SpO2: 98% 96% 95% 94%  Weight:      Height:       1.  General: Patient lying supine in bed,  no acute distress   2. Psychiatric: Alert and oriented x 3, mood and behavior normal for situation, pleasant and cooperative with exam   3. Neurologic: Speech and language are normal, face is symmetric, moves all 4 extremities voluntarily, at baseline without acute deficits on limited exam   4. HEENMT:  Head is atraumatic, normocephalic, pupils reactive to light, neck is supple, trachea is midline, mucous membranes are moist   5. Respiratory : Wheezing throughout without rhonchi, rales, no  cyanosis, accessory muscle use, pursed lips breathing, speaking in 3-4 word sentences, noncompliant with nasal cannula during my exam   6. Cardiovascular : Heart rate normal, rhythm is regular, no murmurs, rubs or gallops, no peripheral edema, peripheral pulses palpated   7. Gastrointestinal:  Abdomen is soft, nondistended, nontender to palpation bowel sounds active, no masses or organomegaly palpated   8. Skin:  Skin is warm, dry and intact without rashes, acute lesions, or ulcers on limited exam   9.Musculoskeletal:  No acute deformities or trauma, no asymmetry in tone, no peripheral edema, peripheral pulses palpated, no tenderness to palpation in the extremities  Data Reviewed: In the ED Temp 98.2, heart rate 93-100, respiratory 16-31, blood pressure 111/64-136/86, satting 84 on room air up to 98% on 4 L nasal cannula  Leukocytosis at 13.8, hemoglobin stable at 14.4 Chemistries unremarkable pH 7.47, pCO2 48 on VBG Chest x-ray shows no active cardiopulmonary disease COVID-negative Patient was treated with Solu-Medrol, mag sulfate, Rocephin, Zithromax, albuterol Admission requested for COPD exacerbation Assessment and Plan: No notes have been filed under this hospital service. Service: Hospitalist     Advance Care Planning:   Code Status: Full Code   Consults: None at this time  Family Communication: No family at bedside  Severity of Illness: The appropriate patient status for this patient is INPATIENT. Inpatient status is judged to be reasonable and necessary in order to provide the required intensity of service to ensure the patient's safety. The patient's presenting symptoms, physical exam findings, and initial radiographic and laboratory data in the context of their chronic comorbidities is felt to place them at high risk for further clinical deterioration. Furthermore, it is not anticipated that the patient will be medically stable for discharge from the hospital within 2  midnights of admission.   * I certify that at the point of admission it is my clinical judgment that the patient will require inpatient hospital care spanning beyond 2 midnights from the point of admission due to high intensity of service, high risk for further deterioration and high frequency of surveillance required.*  Author: Lilyan Gilford, DO 11/14/2022 8:58 PM  For on call review [email or web address].

## 2022-11-14 NOTE — Progress Notes (Signed)
1855 Patient accidentally spilled CAT neblizer and it had to be refilled and started again.

## 2022-11-15 DIAGNOSIS — D72829 Elevated white blood cell count, unspecified: Secondary | ICD-10-CM

## 2022-11-15 DIAGNOSIS — J441 Chronic obstructive pulmonary disease with (acute) exacerbation: Secondary | ICD-10-CM | POA: Diagnosis not present

## 2022-11-15 DIAGNOSIS — F17200 Nicotine dependence, unspecified, uncomplicated: Secondary | ICD-10-CM

## 2022-11-15 DIAGNOSIS — G2581 Restless legs syndrome: Secondary | ICD-10-CM

## 2022-11-15 DIAGNOSIS — F419 Anxiety disorder, unspecified: Secondary | ICD-10-CM

## 2022-11-15 DIAGNOSIS — J9601 Acute respiratory failure with hypoxia: Principal | ICD-10-CM | POA: Insufficient documentation

## 2022-11-15 LAB — MAGNESIUM: Magnesium: 2.6 mg/dL — ABNORMAL HIGH (ref 1.7–2.4)

## 2022-11-15 LAB — CBC WITH DIFFERENTIAL/PLATELET
Abs Immature Granulocytes: 0.04 10*3/uL (ref 0.00–0.07)
Basophils Absolute: 0 10*3/uL (ref 0.0–0.1)
Basophils Relative: 0 %
Eosinophils Absolute: 0 10*3/uL (ref 0.0–0.5)
Eosinophils Relative: 0 %
HCT: 43 % (ref 36.0–46.0)
Hemoglobin: 13.5 g/dL (ref 12.0–15.0)
Immature Granulocytes: 0 %
Lymphocytes Relative: 16 %
Lymphs Abs: 1.8 10*3/uL (ref 0.7–4.0)
MCH: 26.9 pg (ref 26.0–34.0)
MCHC: 31.4 g/dL (ref 30.0–36.0)
MCV: 85.7 fL (ref 80.0–100.0)
Monocytes Absolute: 0.5 10*3/uL (ref 0.1–1.0)
Monocytes Relative: 4 %
Neutro Abs: 9 10*3/uL — ABNORMAL HIGH (ref 1.7–7.7)
Neutrophils Relative %: 80 %
Platelets: 370 10*3/uL (ref 150–400)
RBC: 5.02 MIL/uL (ref 3.87–5.11)
RDW: 14.4 % (ref 11.5–15.5)
WBC: 11.3 10*3/uL — ABNORMAL HIGH (ref 4.0–10.5)
nRBC: 0 % (ref 0.0–0.2)

## 2022-11-15 LAB — COMPREHENSIVE METABOLIC PANEL
ALT: 22 U/L (ref 0–44)
AST: 20 U/L (ref 15–41)
Albumin: 3.7 g/dL (ref 3.5–5.0)
Alkaline Phosphatase: 42 U/L (ref 38–126)
Anion gap: 10 (ref 5–15)
BUN: 10 mg/dL (ref 6–20)
CO2: 28 mmol/L (ref 22–32)
Calcium: 8.7 mg/dL — ABNORMAL LOW (ref 8.9–10.3)
Chloride: 99 mmol/L (ref 98–111)
Creatinine, Ser: 0.48 mg/dL (ref 0.44–1.00)
GFR, Estimated: >60 mL/min (ref >60)
Glucose, Bld: 104 mg/dL — ABNORMAL HIGH (ref 70–99)
Potassium: 5.2 mmol/L — ABNORMAL HIGH (ref 3.5–5.1)
Sodium: 137 mmol/L (ref 135–145)
Total Bilirubin: 0.4 mg/dL (ref 0.3–1.2)
Total Protein: 7 g/dL (ref 6.5–8.1)

## 2022-11-15 LAB — HIV ANTIBODY (ROUTINE TESTING W REFLEX): HIV Screen 4th Generation wRfx: NONREACTIVE

## 2022-11-15 MED ORDER — MOMETASONE FURO-FORMOTEROL FUM 200-5 MCG/ACT IN AERO: 2.0000 | INHALATION_SPRAY | Freq: Two times a day (BID) | RESPIRATORY_TRACT | 0 refills | Status: DC

## 2022-11-15 MED ORDER — PREDNISONE 20 MG PO TABS: 20.0000 mg | ORAL_TABLET | Freq: Every day | ORAL | 0 refills | Status: DC

## 2022-11-15 MED ORDER — IPRATROPIUM-ALBUTEROL 0.5-2.5 (3) MG/3ML IN SOLN: 3.0000 mL | Freq: Four times a day (QID) | RESPIRATORY_TRACT | 0 refills | Status: DC | PRN

## 2022-11-15 MED ORDER — PREDNISONE 20 MG PO TABS: 20.0000 mg | ORAL_TABLET | Freq: Every day | ORAL | 0 refills | Status: AC

## 2022-11-15 NOTE — Assessment & Plan Note (Addendum)
-   Smokes a pack-1-1/2 packs/day Smoking cessation counseling.  Patient will follow up with primary care for further smoking cessation.

## 2022-11-15 NOTE — Discharge Summary (Addendum)
Physician Discharge Summary   Patient: Ruth Dixon MRN: [id number] DOB: 01/14/1971  Admit date:     11/14/2022  Discharge date: 11/15/22  Discharge Physician: York Ram Marvon [place]   PCP: Elfredia Nevins, MD   Recommendations at discharge:    Plan to continue prednisone 20 mg po daily for 5 days. Added inhaled and LABA twice day Added ipratropium to albuterol to use as needed per nebulizer machine. Supplemental 02 per [place] 2/L per min.   Smoking cessation counseling Follow up with Dr Sherwood Gambler in 7 to 10 days.  Recommended outpatient full pulmonary function test.   Patient Saturations on Room Air at Rest = 91% Patient Saturations on Room Air while Ambulating = 85% Patient Saturations on 1 Liters of oxygen while Ambulating = 91%   Discharge Diagnoses: Principal Problem:   COPD exacerbation (HCC) Active Problems:   Leukocytosis   Anxiety   Restless leg   Tobacco use disorder  Resolved Problems:   * No resolved hospital problems. [facility] Course: Ruth Dixon was admitted to the hospital with the working diagnosis of COPD exacerbation.   52 yo female with the past medical history of COPD/ Asthma, hypertension, GERD, anxiety, tobacco abuse and restless leg syndrome who presented with dyspnea.  5 days prior she was diagnosed with COPD exacerbation at a urgent care. She was treated with bronchodilator therapy, steroids and antibiotic therapy. At home her symptoms continue to worsened, her having dyspnea with minimal efforts. The day prior to admission she was seen in the ED for dyspnea, she was noted to be hypoxemic and recommended inpatient treatment. She left against medical advice. Because persistent dyspnea she came back to the ED.  In the emergency room her blood pressure was 145/91, HR 104, RR 24 and 02 saturation 94% on supplemental 02 per [place]. Lungs with wheezing bilaterally with no rhonchi or rales, heart with S1 and S2 present and regular, abdomen with no distention and  no lower extremity edema.   VBG 7.47/ 48/ 48/ 34/ 83% Na 135, K 4.3 Cl 97 bicarbonate 27, glucose 101 bun 11 cr 0,49 Wbc 11,3 hgb 14,4 plt 443   Chest radiograph with hyperinflation with no effusions or infiltrates.   EKG 94 bpm, normal axis, normal intervals, sinus rhythm with no significant ST segment or T wave changes.   Patient was placed on bronchodilator therapy, and systemic steroids.   Assessment and Plan: * COPD exacerbation (HCC) Acute hypoxemic respiratory failure.   Patient with improvement in her symptoms she is very anxious in leaving the hospital.   Her 02 saturation this am is 98% on 1 L/min pern [place]. She reports her dyspnea having significant improvement.   Plan to check ambulatory 02 on room air.  Continue with bronchodilator therapy, duoneb as needed q 6 hrs Oral prednisone 20 mg for 5 more days.  Add inhaled corticosteroid  with dulera.  If continue to improve, plan to discharge home and have a close follow up as outpatient.  Patient will need outpatient PFT and aggressive smoking cessation.   Leukocytosis Reactive leukocytosis.  No signs of bacterial infection, follow up cell count as outpatient, once completed steroid taper.   Anxiety - Continue BuSpar  Restless leg - Continue Requip  Tobacco use disorder - Smokes a pack-1-1/2 packs/day Smoking cessation counseling.  Patient will follow up with primary care for further smoking cessation.    Consultants: none  Procedures performed: none  Disposition: Home Diet recommendation:  Discharge Diet Orders (From admission, onward)  Start     Ordered   11/15/22 0000  Diet - low sodium heart healthy        11/15/22 1101           Regular diet DISCHARGE MEDICATION: Allergies as of 11/15/2022   No Known Allergies      Medication List     STOP taking these medications    doxycycline 100 MG capsule Commonly known as: VIBRAMYCIN       TAKE these medications    albuterol 108 (90  Base) MCG/ACT inhaler Commonly known as: VENTOLIN HFA Inhale 2 puffs into the lungs every 4 (four) hours as needed. What changed: Another medication with the same name was removed. Continue taking this medication, and follow the directions you see here.   busPIRone 10 MG tablet Commonly known as: BUSPAR Take 5 mg by mouth 3 (three) times daily.   furosemide 20 MG tablet Commonly known as: LASIX Take 20 mg by mouth daily.   HYDROcodone-acetaminophen 10-325 MG tablet Commonly known as: NORCO Take 1 tablet by mouth every 4 (four) hours as needed.   ipratropium-albuterol 0.5-2.5 (3) MG/3ML Soln Commonly known as: DUONEB Take 3 mLs by nebulization every 6 (six) hours as needed (as needed for shortness of breath or wheezing.).   mometasone-formoterol 200-5 MCG/ACT Aero Commonly known as: DULERA Inhale 2 puffs into the lungs 2 (two) times daily.   multivitamin tablet Take 1 tablet by mouth daily.   predniSONE 20 MG tablet Commonly known as: DELTASONE Take 1 tablet (20 mg total) by mouth daily with breakfast for 5 days. Start taking on: November 16, 2022 What changed:  medication strength how much to take how to take this when to take this additional instructions   rOPINIRole 0.5 MG tablet Commonly known as: REQUIP Take 0.5 mg by mouth at bedtime.   THERAFLU-D PO Take by mouth at bedtime.   zolpidem 10 MG tablet Commonly known as: AMBIEN Take 10 mg by mouth at bedtime as needed.               Durable Medical Equipment  (From admission, onward)           Start     Ordered   11/15/22 1144  For home use only DME oxygen  Once       Question Answer Comment  Length of Need 12 Months   Mode or (Route) Nasal cannula   Liters per Minute 2   Frequency Continuous (stationary and portable oxygen unit needed)   Oxygen conserving device Yes   Oxygen delivery system Gas      11/15/22 1144            Discharge Exam: Filed Weights   11/14/22 1704  Weight:  64.4 kg   BP (!) 138/96   Pulse 76   Temp 98.4 F (36.9 C) (Oral)   Resp 15   Ht 5\' 8"  (1.727 m)   Wt 64.4 kg   LMP 02/21/2017 (Within Weeks)   SpO2 94%   BMI 21.59 kg/m   Patient with no chest pain, reports significant improvement in dyspnea, no cough or chest pain.  Neurology awake and alert ENT with no pallor Cardiovascular with S1 and S2 present and regular with no gallops, rubs or murmurs Respiratory with prolonged expiratory phase and decreased  breath sounds with no significant wheezing or rhonchi.  Abdomen with no distention  No  lower extremity edema   Condition at discharge: stable  The results of significant diagnostics  from this hospitalization (including imaging, microbiology, ancillary and laboratory) are listed below for reference.   Imaging Studies: DG Chest 2 View  Result Date: 11/14/2022 CLINICAL DATA:  COPD EXAM: CHEST - 2 VIEW COMPARISON:  Chest x-ray 11/13/2022 FINDINGS: The heart size and mediastinal contours are within normal limits. Both lungs are clear. The visualized skeletal structures are unremarkable. IMPRESSION: No active cardiopulmonary disease. Electronically Signed   By: Darliss Cheney M.D.   On: 11/14/2022 19:53   DG Chest 2 View  Result Date: 11/13/2022 CLINICAL DATA:  Shortness of breath and productive cough EXAM: CHEST - 2 VIEW COMPARISON:  11/25/2021 FINDINGS: The heart size and mediastinal contours are within normal limits. Hyperinflation and coarsened interstitial markings similar to prior. Both lungs are clear. The visualized skeletal structures are unremarkable. IMPRESSION: No active cardiopulmonary disease. Electronically Signed   By: Minerva Fester M.D.   On: 11/13/2022 19:21    Microbiology: Results for orders placed or performed during the hospital encounter of 11/13/22  Resp panel by RT-PCR (RSV, Flu A&B, Covid) Anterior Nasal Swab     Status: None   Collection Time: 11/13/22  5:28 PM   Specimen: Anterior Nasal Swab  Result  Value Ref Range Status   SARS Coronavirus 2 by RT PCR NEGATIVE NEGATIVE Final    Comment: (NOTE) SARS-CoV-2 target nucleic acids are NOT DETECTED.  The SARS-CoV-2 RNA is generally detectable in upper respiratory specimens during the acute phase of infection. The lowest concentration of SARS-CoV-2 viral copies this assay can detect is 138 copies/mL. A negative result does not preclude SARS-Cov-2 infection and should not be used as the sole basis for treatment or other patient management decisions. A negative result may occur with  improper specimen collection/handling, submission of specimen other than nasopharyngeal swab, presence of viral mutation(s) within the areas targeted by this assay, and inadequate number of viral copies(<138 copies/mL). A negative result must be combined with clinical observations, patient history, and epidemiological information. The expected result is Negative.  Fact Sheet for Patients:  BloggerCourse.com  Fact Sheet for Healthcare Providers:  SeriousBroker.it  This test is no t yet approved or cleared by the [place] FDA and  has been authorized for detection and/or diagnosis of SARS-CoV-2 by FDA under an Emergency Use Authorization (EUA). This EUA will remain  in effect (meaning this test can be used) for the duration of the COVID-19 declaration under Section 564(b)(1) of the Act, 21 U.S.C.section 360bbb-3(b)(1), unless the authorization is terminated  or revoked sooner.       Influenza A by PCR NEGATIVE NEGATIVE Final   Influenza B by PCR NEGATIVE NEGATIVE Final    Comment: (NOTE) The Xpert Xpress SARS-CoV-2/FLU/RSV plus assay is intended as an aid in the diagnosis of influenza from Nasopharyngeal swab specimens and should not be used as a sole basis for treatment. Nasal washings and aspirates are unacceptable for Xpert Xpress SARS-CoV-2/FLU/RSV testing.  Fact Sheet for  Patients: BloggerCourse.com  Fact Sheet for Healthcare Providers: SeriousBroker.it  This test is not yet approved or cleared by the [place] FDA and has been authorized for detection and/or diagnosis of SARS-CoV-2 by FDA under an Emergency Use Authorization (EUA). This EUA will remain in effect (meaning this test can be used) for the duration of the COVID-19 declaration under Section 564(b)(1) of the Act, 21 U.S.C. section 360bbb-3(b)(1), unless the authorization is terminated or revoked.     Resp Syncytial Virus by PCR NEGATIVE NEGATIVE Final    Comment: (NOTE) Fact Sheet for  Patients: BloggerCourse.com  Fact Sheet for Healthcare Providers: SeriousBroker.it  This test is not yet approved or cleared by the [place] FDA and has been authorized for detection and/or diagnosis of SARS-CoV-2 by FDA under an Emergency Use Authorization (EUA). This EUA will remain in effect (meaning this test can be used) for the duration of the COVID-19 declaration under Section 564(b)(1) of the Act, 21 U.S.C. section 360bbb-3(b)(1), unless the authorization is terminated or revoked.  Performed at [facility], [address], [place], [place] [address]     Labs: CBC: Recent Labs  Lab 11/13/22 1750 11/14/22 1723 11/15/22 0415  WBC 8.4 13.8* 11.3*  NEUTROABS 5.3  --  9.0*  HGB 14.1 14.4 13.5  HCT 43.2 45.8 43.0  MCV 83.4 85.1 85.7  PLT 404* 443* 370   Basic Metabolic Panel: Recent Labs  Lab 11/13/22 1750 11/14/22 1723 11/15/22 0415  NA 134* 135 137  K 3.8 4.3 5.2*  CL 98 97* 99  CO2 27 27 28   GLUCOSE 101* 101* 104*  BUN 10 11 10   CREATININE 0.38* 0.49 0.48  CALCIUM 8.6* 9.1 8.7*  MG  --  2.3 2.6*   Liver Function Tests: Recent Labs  Lab 11/14/22 1723 11/15/22 0415  AST 18 20  ALT 21 22  ALKPHOS 47 42  BILITOT 0.5 0.4  PROT 7.5 7.0  ALBUMIN 4.0 3.7   CBG: No  results for input(s): "GLUCAP" in the last 168 hours.  Discharge time spent: greater than 30 minutes.  Signed: Coralie Keens, MD [facility] 11/15/2022

## 2022-11-15 NOTE — Hospital Course (Signed)
Ruth Dixon was admitted to the hospital with the working diagnosis of COPD exacerbation.   52 yo female with the past medical history of COPD/ Asthma, hypertension, GERD, anxiety, tobacco abuse and restless leg syndrome who presented with dyspnea.  5 days prior she was diagnosed with COPD exacerbation at a urgent care. She was treated with bronchodilator therapy, steroids and antibiotic therapy. At home her symptoms continue to worsened, her having dyspnea with minimal efforts. The day prior to admission she was seen in the ED for dyspnea, she was noted to be hypoxemic and recommended inpatient treatment. She left against medical advice. Because persistent dyspnea she came back to the ED.  In the emergency room her blood pressure was 145/91, HR 104, RR 24 and 02 saturation 94% on supplemental 02 per [place]. Lungs with wheezing bilaterally with no rhonchi or rales, heart with S1 and S2 present and regular, abdomen with no distention and no lower extremity edema.   VBG 7.47/ 48/ 48/ 34/ 83% Na 135, K 4.3 Cl 97 bicarbonate 27, glucose 101 bun 11 cr 0,49 Wbc 11,3 hgb 14,4 plt 443   Chest radiograph with hyperinflation with no effusions or infiltrates.   EKG 94 bpm, normal axis, normal intervals, sinus rhythm with no significant ST segment or T wave changes.   Patient was placed on bronchodilator therapy, and systemic steroids.

## 2022-11-15 NOTE — Assessment & Plan Note (Signed)
Continue BuSpar.

## 2022-11-15 NOTE — Assessment & Plan Note (Signed)
Acute hypoxemic respiratory failure.   Patient with improvement in her symptoms she is very anxious in leaving the hospital.   Her 02 saturation this am is 98% on 1 L/min pern [place]. She reports her dyspnea having significant improvement.   Plan to check ambulatory 02 on room air.  Continue with bronchodilator therapy, duoneb as needed q 6 hrs Oral prednisone 20 mg for 5 more days.  Add inhaled corticosteroid  with dulera.  If continue to improve, plan to discharge home and have a close follow up as outpatient.  Patient will need outpatient PFT and aggressive smoking cessation.

## 2022-11-15 NOTE — Assessment & Plan Note (Signed)
Continue Requip.

## 2022-11-15 NOTE — Progress Notes (Addendum)
   11/15/22 1155  TOC Brief Assessment  Insurance and Status Reviewed  Patient has primary care physician Yes  Home environment has been reviewed From Home  Prior level of function: Independent  Prior/Current Home Services No current home services  Social Determinants of Health Reivew SDOH reviewed no interventions necessary  Readmission risk has been reviewed Yes  Transition of care needs transition of care needs identified, TOC will continue to follow   CSW spoke with pt, discussed new home 02 need.  Pt accepted [employer] for home 02 needs.  And a temporary tank to be delivered to the hospital room for her today.  Secure chat to MD regarding referral to provider and need for orders.  No further TOC needs.    Addendum:  CSW spoke with pt at bedside when delivered tank, handed pt the [employer] contact number for Onalee Hua- to call when DC to arrange home set up today.

## 2022-11-15 NOTE — Assessment & Plan Note (Deleted)
-   With wheezing, dyspnea on exertion, hypoxia - VBG showed a pH of 7.47 and a pCO2 of 48 - Chest x-ray showed no acute cardiopulmonary disease - Patient was treated with steroid, mag sulfate, Rocephin, Zithromax, albuterol in the ED - Continue scheduled DuoNebs and as needed albuterol - Continue oxygen supplementation - Continue to monitor

## 2022-11-15 NOTE — Progress Notes (Signed)
SATURATION QUALIFICATIONS: (This note is used to comply with regulatory documentation for home oxygen)  Patient Saturations on Room Air at Rest = 91%  Patient Saturations on Room Air while Ambulating = 85%  Patient Saturations on 1 Liters of oxygen while Ambulating = 91%  Please briefly explain why patient needs home oxygen:

## 2022-11-15 NOTE — Assessment & Plan Note (Addendum)
Reactive leukocytosis.  No signs of bacterial infection, follow up cell count as outpatient, once completed steroid taper.

## 2022-11-15 NOTE — Assessment & Plan Note (Deleted)
-   Oxygen sats down to mid 80s at home - Patient reports it was all she could do to make it from the bedroom to the living room because she was so short of breath on exertion - Speaking in 3-4 word phrases, with pursed lips breathing - Down to 2 L nasal cannula at this time - Continue to wean off - Continue to treat COPD exacerbation - Continue to monitor

## 2022-11-26 ENCOUNTER — Other Ambulatory Visit: Payer: Self-pay | Admitting: Family Medicine

## 2022-12-04 ENCOUNTER — Encounter: Payer: Self-pay | Admitting: *Deleted

## 2023-01-08 ENCOUNTER — Ambulatory Visit: Payer: 59 | Admitting: Internal Medicine

## 2023-01-08 ENCOUNTER — Encounter: Payer: Self-pay | Admitting: Internal Medicine

## 2023-01-08 VITALS — BP 122/79 | HR 97 | Ht 68.0 in | Wt 146.0 lb

## 2023-01-08 DIAGNOSIS — J4489 Other specified chronic obstructive pulmonary disease: Secondary | ICD-10-CM | POA: Insufficient documentation

## 2023-01-08 DIAGNOSIS — F17210 Nicotine dependence, cigarettes, uncomplicated: Secondary | ICD-10-CM | POA: Diagnosis not present

## 2023-01-08 DIAGNOSIS — J984 Other disorders of lung: Secondary | ICD-10-CM | POA: Diagnosis not present

## 2023-01-08 MED ORDER — BREZTRI AEROSPHERE 160-9-4.8 MCG/ACT IN AERO: INHALATION_SPRAY | RESPIRATORY_TRACT | 11 refills | Status: DC

## 2023-01-08 MED ORDER — METHYLPREDNISOLONE ACETATE 80 MG/ML IJ SUSP
120.0000 mg | Freq: Once | INTRAMUSCULAR | Status: AC
Start: 2023-01-08 — End: 2023-01-08
  Administered 2023-01-08: 120 mg via INTRAMUSCULAR

## 2023-01-08 NOTE — Assessment & Plan Note (Signed)
Referred for LCS  01/08/2023 >>>   Low-dose CT lung cancer screening is recommended for patients who are 20-54 years of age with a 20+ pack-year history of smoking and who are currently smoking or quit <=15 years ago. No coughing up blood  No unintentional weight loss of > 15 pounds in the last 6 months - pt is eligible for scanning yearly until 75 y from last smoking > referred today (sister died of lung ca)  Counseled re importance of smoking cessation but did not meet time criteria for separate billing           Each maintenance medication was reviewed in detail including emphasizing most importantly the difference between maintenance and prns and under what circumstances the prns are to be triggered using an action plan format where appropriate.  Total time for H and P, chart review, counseling, reviewing hfa  device(s) and generating customized AVS unique to this office visit / same day charting = 45 pt new to me

## 2023-01-08 NOTE — Assessment & Plan Note (Addendum)
Active smoker with lifelong asthma and freq exac 2024  - 01/08/2023  After extensive coaching inhaler device,  effectiveness =    75% (short ti) > breztri 2bid x 6 weeks then regroup  Will need pfts for GOLD staging but clinically  Group D (now reclassified as E) in terms of symptom/risk and laba/lama/ICS  therefore appropriate rx at this point >>>  breztri and much more approp saba:  Re SABA :  I spent extra time with pt today reviewing appropriate use of albuterol for prn use on exertion with the following points: 1) saba is for relief of sob that does not improve by walking a slower pace or resting but rather if the pt does not improve after trying this first. 2) If the pt is convinced, as many are, that saba helps recover from activity faster then it's easy to tell if this is the case by re-challenging : ie stop, take the inhaler, then p 5 minutes try the exact same activity (intensity of workload) that just caused the symptoms and see if they are substantially diminished or not after saba 3) if there is an activity that reproducibly causes the symptoms, try the saba 15 min before the activity on alternate days   If in fact the saba really does help, then fine to continue to use it prn but advised may need to look closer at the maintenance regimen being used to achieve better control of airways disease with exertion.   >>> f/u 6 weeks with all meds in hand using a trust but verify approach to confirm accurate Medication  Reconciliation The principal here is that until we are certain that the  patients are doing what we've asked, it makes no sense to ask them to do more.          Each maintenance medication was reviewed in detail including emphasizing most importantly the difference between maintenance and prns and under what circumstances the prns are to be triggered using an action plan format where appropriate.  Total time for H and P, chart review, counseling, reviewing hfa/neb  device(s)  and generating customized AVS unique to this office visit / same day charting = 45 min new pt eval

## 2023-01-08 NOTE — Progress Notes (Signed)
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    Also denies any obvious fluctuation of symptoms with weather or environmental changes or other aggravating or alleviating factors except as outlined above   No unusual exposure hx or h/o childhood pna  or knowledge of premature birth.  Current Allergies, Complete Past Medical History, Past Surgical History, Family History, and Social History were reviewed in [employer] record.  ROS  The following are not active complaints unless bolded Hoarseness, sore throat, dysphagia, dental problems, itching, sneezing,  nasal congestion or discharge of excess mucus or purulent secretions, ear ache,   fever, chills, sweats, unintended wt loss or wt gain, classically pleuritic or exertional cp,  orthopnea pnd or arm/hand swelling  or leg swelling, presyncope, palpitations, abdominal pain, anorexia, nausea, vomiting, diarrhea  or change in  bowel habits or change in bladder habits, change in stools or change in urine, dysuria, hematuria,  rash, arthralgias, visual complaints, headache, numbness, weakness or ataxia or problems with walking or coordination,  change in mood or  memory.            Outpatient Medications Prior to Visit  Medication Sig Dispense Refill   busPIRone (BUSPAR) 10 MG tablet Take 5 mg by mouth 3 (three) times daily.     EPINEPHrine (PRIMATENE MIST) 0.125 MG/ACT AERO Inhale into the lungs.     furosemide (LASIX) 20 MG tablet Take 20 mg by mouth daily.     HYDROcodone-acetaminophen (NORCO) 10-325 MG tablet Take 1 tablet by mouth every 4 (four) hours as needed.     Multiple Vitamin (MULTIVITAMIN) tablet Take 1 tablet by mouth daily.     Pseudoephedrine-APAP-DM (THERAFLU-D PO) Take by mouth at bedtime.     rOPINIRole (REQUIP) 0.5 MG tablet Take 0.5 mg by mouth at bedtime.     zolpidem (AMBIEN) 10 MG tablet Take 10 mg by mouth at bedtime as needed.     albuterol (PROVENTIL) 2 MG tablet Take 2 mg by mouth 3 (three) times daily.     albuterol (VENTOLIN HFA) 108 (90 Base) MCG/ACT inhaler Inhale 2 puffs into the lungs every 4 (four) hours as needed. (Patient not taking: Reported on 01/08/2023) 18 g 0   ipratropium-albuterol (DUONEB) 0.5-2.5 (3) MG/3ML SOLN Take 3 mLs by nebulization every 6 (six) hours as needed (as needed for shortness of breath or  wheezing.). (Patient not taking: Reported on 01/08/2023) 360 mL 0   mometasone-formoterol (DULERA) 200-5 MCG/ACT AERO Inhale 2 puffs into the lungs 2 (two) times daily. 1 each 0   No facility-administered medications prior to visit.    Past Medical History:  Diagnosis Date   Asthma    Hypertension    IBS (irritable bowel syndrome)    Pleurisy    multiple times   Reflux       Objective:     BP 122/79   Pulse 97   Ht 5\' 8"  (1.727 m)   Wt 146 lb (66.2 kg)   LMP 02/21/2017 (Within Weeks)   SpO2 93%   BMI 22.20 kg/m   SpO2: 93 % RA  Amb slt anxious wf  nad    HEENT : Oropharynx  clear       NECK :  without  apparent JVD/ palpable Nodes/TM    LUNGS: no acc muscle use,  Min barrel  contour chest wall with bilateral  sonorous exp rhonchi and  without cough on insp or exp maneuvers and min  Hyperresonant  to  percussion bilaterally    CV:  RRR  no s3 or murmur or increase in P2, and no edema   ABD:  soft and nontender with pos end  insp Hoover's  in the supine position.  No bruits or organomegaly appreciated   MS:  Nl gait/ ext warm without deformities Or obvious joint restrictions  calf tenderness, cyanosis or clubbing     SKIN: warm and dry without lesions    NEURO:  alert, approp, nl sensorium with  no motor or cerebellar deficits apparent.            Assessment   No problem-specific Assessment & Plan notes found for this encounter.     Ruth Hughs, MD 01/08/2023

## 2023-01-08 NOTE — Patient Instructions (Addendum)
Plan A = Automatic = Always=    Breztri Take 2 puffs first thing in am and then another 2 puffs about 12 hours later.    Work on inhaler technique:  relax and gently blow all the way out then take a nice smooth full deep breath back in, triggering the inhaler at same time you start breathing in.  Hold breath in for at least  5 seconds if you can. Blow out breztri  thru nose. Rinse and gargle with water when done.  If mouth or throat bother you at all,  try brushing teeth/gums/tongue with arm and hammer toothpaste/ make a slurry and gargle and spit out.      Plan B = Backup (to supplement plan A, not to replace it) Only use your albuterol inhaler as a rescue medication to be used if you can't catch your breath by resting or doing a relaxed purse lip breathing pattern.  - The less you use it, the better it will work when you need it. - Ok to use the inhaler up to 2 puffs  every 4 hours if you must but call for appointment if use goes up over your usual need - Don't leave home without it !!  (think of it like the spare tire for your car)   Plan C = Crisis (instead of Plan B but only if Plan B stops working) - only use your albuterol nebulizer if you first try Plan B and it fails to help > ok to use the nebulizer up to every 4 hours but if start needing it regularly call for immediate appointment   For cough / congestion > mucinex dm 1200 mg every 12 hours as needed   The key is to stop smoking completely before smoking completely stops you!  My office will be contacting you by phone for referral to Lung cancer screening   - if you don't hear back from my office within one week please call us back or notify us thru MyChart and we'll address it right away.   Please schedule a follow up office visit in 6 weeks, call sooner if needed with all medications /inhalers/ solutions in hand so we can verify exactly what you are taking. This includes all medications from all doctors and over the counters

## 2023-04-13 ENCOUNTER — Ambulatory Visit
Admission: EM | Admit: 2023-04-13 | Discharge: 2023-04-13 | Disposition: A | Discharge status: Home / Self Care | Attending: Nurse Practitioner | Admitting: Nurse Practitioner

## 2023-04-13 DIAGNOSIS — J441 Chronic obstructive pulmonary disease with (acute) exacerbation: Secondary | ICD-10-CM

## 2023-04-13 DIAGNOSIS — J101 Influenza due to other identified influenza virus with other respiratory manifestations: Secondary | ICD-10-CM | POA: Diagnosis not present

## 2023-04-13 LAB — POC COVID19/FLU A&B COMBO
Covid Antigen, POC: NEGATIVE
Influenza A Antigen, POC: POSITIVE — AB
Influenza B Antigen, POC: NEGATIVE

## 2023-04-13 MED ORDER — PROMETHAZINE-DM 6.25-15 MG/5ML PO SYRP: 5.0000 mL | ORAL_SOLUTION | Freq: Four times a day (QID) | ORAL | 0 refills | Status: DC | PRN

## 2023-04-13 MED ORDER — ALBUTEROL SULFATE HFA 108 (90 BASE) MCG/ACT IN AERS: 2.0000 | INHALATION_SPRAY | Freq: Four times a day (QID) | RESPIRATORY_TRACT | 0 refills | Status: DC | PRN

## 2023-04-13 MED ORDER — OSELTAMIVIR PHOSPHATE 75 MG PO CAPS: 75.0000 mg | ORAL_CAPSULE | Freq: Two times a day (BID) | ORAL | 0 refills | Status: AC

## 2023-04-13 MED ORDER — IPRATROPIUM-ALBUTEROL 0.5-2.5 (3) MG/3ML IN SOLN
3.0000 mL | Freq: Once | RESPIRATORY_TRACT | Status: AC
  Administered 2023-04-13: 3 mL via RESPIRATORY_TRACT

## 2023-04-13 MED ORDER — METHYLPREDNISOLONE SODIUM SUCC 125 MG IJ SOLR
125.0000 mg | Freq: Once | INTRAMUSCULAR | Status: AC
  Administered 2023-04-13: 125 mg via INTRAMUSCULAR

## 2023-04-13 MED ORDER — PREDNISONE 20 MG PO TABS: 40.0000 mg | ORAL_TABLET | Freq: Every day | ORAL | 0 refills | Status: AC

## 2023-04-13 NOTE — Discharge Instructions (Signed)
 You have tested positive for influenza A. Continue over-the-counter Tylenol or ibuprofen as needed for pain, fever, or general discomfort. Try to drink at least 8-10 8 ounce glasses of water daily.  Recommend the use of Pedialyte or Gatorade to prevent dehydration. Recommend using a humidifier in your bedroom at nighttime during sleep and sleeping elevated on pillows while cough symptoms persist. You should remain home until you have been fever free for 24 hours with no medication. Please be advised that symptoms should improve over the next 5 to 7 days.  If symptoms fail to improve, or appear to be worsening, you may follow-up in this clinic or with your primary care physician for further evaluation. Follow-up as needed.

## 2023-04-13 NOTE — ED Provider Notes (Signed)
 [facility]    CSN: [id number] Arrival date & time: 04/13/23  1634      History   Chief Complaint Chief Complaint  Patient presents with  . Cough    HPI Ruth Dixon is a 53 y.o. female.   The history is provided by the patient.   Patient presents for complaints of cough, shortness of breath, fever, sore throat, headache, and nausea.  Symptoms have been present for the past 2 days.  Denies ear pain, ear drainage, difficulty breathing, chest pain, abdominal pain, vomiting, diarrhea, or rash.  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  Past Medical History:  Diagnosis Date  . Asthma   . Hypertension   . IBS (irritable bowel syndrome)   . Pleurisy    multiple times  . Reflux     Patient Active Problem List   Diagnosis Date Noted  . Asthmatic bronchitis , chronic (HCC) 01/08/2023  . Leukocytosis 11/15/2022  . Anxiety 11/15/2022  . Restless leg 11/15/2022  . Acute respiratory failure with hypoxia (HCC) 11/15/2022  . Tobacco use disorder 11/15/2022  . COPD exacerbation (HCC) 11/14/2022  . Trigger ring finger of right hand 04/25/2019  . Restrictive airway disease 01/14/2019  . Cigarette smoker 01/06/2019  . Rectal bleeding 01/07/2018  . Abdominal pain 01/06/2018  . Dysphagia 01/06/2018  . Nausea with vomiting 01/06/2018  . GERD (gastroesophageal reflux disease) 01/06/2018    Past Surgical History:  Procedure Laterality Date  . BIOPSY  01/21/2018   Procedure: BIOPSY;  Surgeon: Corbin Ade, MD;  Location: [facility];  Service: Endoscopy;;  esopahgus/GE junction  . COLONOSCOPY WITH PROPOFOL N/A 01/21/2018   Procedure: COLONOSCOPY WITH PROPOFOL;  Surgeon: Corbin Ade, MD;  Location: [facility];  Service: Endoscopy;  Laterality: N/A;  12:45pm  . ESOPHAGOGASTRODUODENOSCOPY (EGD) WITH PROPOFOL N/A 01/21/2018   Procedure: ESOPHAGOGASTRODUODENOSCOPY (EGD) WITH PROPOFOL;  Surgeon: Corbin Ade, MD;  [facility]: [facility];  Service: Endoscopy;  Laterality: N/A;  . HERNIA REPAIR    . MALONEY DILATION N/A 01/21/2018   Procedure: Elease Hashimoto DILATION;  Surgeon: Corbin Ade, MD;  Location: [facility];  Service: Endoscopy;  Laterality: N/A;    OB History   No obstetric history on file.      Home Medications    Prior to Admission medications   Medication Sig Start Date End Date Taking? Authorizing Provider  busPIRone (BUSPAR) 10 MG tablet Take 5 mg by mouth 3 (three) times daily.   Yes [provider]  furosemide (LASIX) 20 MG tablet Take 20 mg by mouth daily.   Yes [provider]  HYDROcodone-acetaminophen (NORCO) 10-325 MG tablet Take 1 tablet by mouth every 4 (four) hours as needed. 11/22/21  Yes [provider]  Multiple Vitamin (MULTIVITAMIN) tablet Take 1 tablet by mouth daily.   Yes [provider]  rOPINIRole (REQUIP) 0.5 MG tablet Take 0.5 mg by mouth at bedtime.   Yes [provider]  albuterol (VENTOLIN HFA) 108 (90 Base) MCG/ACT inhaler Inhale 2 puffs into the lungs every 4 (four) hours as needed. Patient not taking: Reported on 01/08/2023 11/09/22   Particia Nearing, PA-C  Budeson-Glycopyrrol-Formoterol (BREZTRI AEROSPHERE) 160-9-4.8 MCG/ACT AERO Take 2 puffs first thing in am and then another 2 puffs about 12 hours later. 01/08/23   Nyoka Cowden, MD  EPINEPHrine (PRIMATENE MIST) 0.125 MG/ACT AERO Inhale into the lungs.    [provider]  ipratropium-albuterol (DUONEB) 0.5-2.5 (  3) MG/3ML SOLN Take 3 mLs by nebulization every 6 (six) hours as needed (as needed for shortness of breath or wheezing.). Patient not taking: Reported on 01/08/2023 11/15/22   [name], York Ram, MD  Pseudoephedrine-APAP-DM (THERAFLU-D PO) Take by mouth at bedtime.    [provider]  zolpidem (AMBIEN) 10 MG tablet Take 10 mg by mouth at bedtime as needed. 11/07/22   [provider]    Family History Family History  Problem  Relation Age of Onset  . Congestive Heart Failure Mother   . Diabetes Maternal Grandmother   . COPD Maternal Grandmother   . Stroke Maternal Grandmother   . COPD Maternal Grandfather   . COPD Paternal Grandmother   . COPD Paternal Grandfather   . Colon cancer Neg Hx     Social History Social History   Tobacco Use  . Smoking status: Every Day    Current packs/day: 1.00    Types: Cigarettes  . Smokeless tobacco: Never  Vaping Use  . Vaping status: Never Used  Substance Use Topics  . Alcohol use: Not Currently  . Drug use: No     Allergies   Patient has no known allergies.   Review of Systems Review of Systems Per HPI  Physical Exam Triage Vital Signs ED Triage Vitals  Encounter Vitals Group     BP 04/13/23 1733 116/72     Systolic BP Percentile --      Diastolic BP Percentile --      Pulse Rate 04/13/23 1733 100     Resp 04/13/23 1733 20     Temp 04/13/23 1733 98 F (36.7 C)     Temp Source 04/13/23 1733 Oral     SpO2 04/13/23 1733 94 %     Weight --      Height --      Head Circumference --      Peak Flow --      Pain Score 04/13/23 1734 0     Pain Loc --      Pain Education --      Exclude from Growth Chart --    No data found.  Updated Vital Signs BP 116/72 (BP Location: Right Arm)   Pulse 100   Temp 98 F (36.7 C) (Oral)   Resp 20   LMP 02/21/2017 (Within Weeks)   SpO2 94%   Visual Acuity Right Eye Distance:   Left Eye Distance:   Bilateral Distance:    Right Eye Near:   Left Eye Near:    Bilateral Near:     Physical Exam Vitals and nursing note reviewed.  Constitutional:      General: She is not in acute distress.    Appearance: Normal appearance.  HENT:     Head: Normocephalic.  Eyes:     Extraocular Movements: Extraocular movements intact.     Pupils: Pupils are equal, round, and reactive to light.  Cardiovascular:     Rate and Rhythm: Normal rate and regular rhythm.     Pulses: Normal pulses.     Heart sounds: Normal  heart sounds.  Pulmonary:     Effort: Pulmonary effort is normal.     Breath sounds: Examination of the right-upper field reveals wheezing and rhonchi. Examination of the left-upper field reveals wheezing and rhonchi. Examination of the right-lower field reveals wheezing and rhonchi. Examination of the left-lower field reveals wheezing and rhonchi. Wheezing and rhonchi present.  Musculoskeletal:     Cervical back: Normal range of motion.  Skin:    General: Skin is warm and dry.  Neurological:     General: No focal deficit present.     Mental Status: She is alert and oriented to person, place, and time.  Psychiatric:        Mood and Affect: Mood normal.        Behavior: Behavior normal.     UC Treatments / Results  Labs (all labs ordered are listed, but only abnormal results are displayed) Labs Reviewed  POC COVID19/FLU A&B COMBO - Abnormal; Notable for the following components:      Result Value   Influenza A Antigen, POC Positive (*)    All other components within normal limits    EKG   Radiology No results found.  Procedures Procedures (including critical care time)  Medications Ordered in UC Medications - No data to display  Initial Impression / Assessment and Plan / UC Course  I have reviewed the triage vital signs and the nursing notes.  Pertinent labs & imaging results that were available during my care of the patient were reviewed by me and considered in my medical decision making (see chart for details).     *** Final Clinical Impressions(s) / UC Diagnoses   Final diagnoses:  None   Discharge Instructions   None    ED Prescriptions   None    PDMP not reviewed this encounter.

## 2023-04-13 NOTE — ED Triage Notes (Signed)
 Cough, SOB with exertion, sore throat, nausea, headache x 2 days.

## 2023-10-23 ENCOUNTER — Ambulatory Visit
Admission: EM | Admit: 2023-10-23 | Discharge: 2023-10-23 | Disposition: A | Discharge status: Home / Self Care | Attending: Family Medicine | Admitting: Family Medicine

## 2023-10-23 DIAGNOSIS — R000 Tachycardia, unspecified: Secondary | ICD-10-CM

## 2023-10-23 DIAGNOSIS — R0682 Tachypnea, not elsewhere classified: Secondary | ICD-10-CM | POA: Diagnosis not present

## 2023-10-23 DIAGNOSIS — J4541 Moderate persistent asthma with (acute) exacerbation: Secondary | ICD-10-CM | POA: Diagnosis not present

## 2023-10-23 MED ORDER — DEXAMETHASONE SODIUM PHOSPHATE 10 MG/ML IJ SOLN
10.0000 mg | Freq: Once | INTRAMUSCULAR | Status: AC
  Administered 2023-10-23: 10 mg via INTRAMUSCULAR

## 2023-10-23 MED ORDER — AZITHROMYCIN 250 MG PO TABS: ORAL_TABLET | ORAL | 0 refills | Status: DC

## 2023-10-23 MED ORDER — PREDNISONE 20 MG PO TABS: 40.0000 mg | ORAL_TABLET | Freq: Every day | ORAL | 0 refills | Status: DC

## 2023-10-23 NOTE — ED Provider Notes (Signed)
 [facility]    CSN: [id number] Arrival date & time: 10/23/23  1243      History   Chief Complaint Chief Complaint  Patient presents with   Cough    HPI Ruth Dixon is a 53 y.o. female.   Pt presents to UC for c/o shortness of breath, cough, fatigue x2 days. Worse at night. Hx asthma. Using inhaler yesterday and leftover prednisone .       Past Medical History:  Diagnosis Date   Asthma    Hypertension    IBS (irritable bowel syndrome)    Pleurisy    multiple times   Reflux     Patient Active Problem List   Diagnosis Date Noted   Asthmatic bronchitis , chronic (HCC) 01/08/2023   Leukocytosis 11/15/2022   Anxiety 11/15/2022   Restless leg 11/15/2022   Acute respiratory failure with hypoxia (HCC) 11/15/2022   Tobacco use disorder 11/15/2022   COPD exacerbation (HCC) 11/14/2022   Trigger ring finger of right hand 04/25/2019   Restrictive airway disease 01/14/2019   Cigarette smoker 01/06/2019   Rectal bleeding 01/07/2018   Abdominal pain 01/06/2018   Dysphagia 01/06/2018   Nausea with vomiting 01/06/2018   GERD (gastroesophageal reflux disease) 01/06/2018    Past Surgical History:  Procedure Laterality Date   BIOPSY  01/21/2018   Procedure: BIOPSY;  Surgeon: Shaaron Lamar HERO, MD;  Location: [facility];  Service: Endoscopy;;  esopahgus/GE junction   COLONOSCOPY WITH PROPOFOL  N/A 01/21/2018   Procedure: COLONOSCOPY WITH PROPOFOL ;  Surgeon: Shaaron Lamar HERO, MD;  Location: [facility];  Service: Endoscopy;  Laterality: N/A;  12:45pm   ESOPHAGOGASTRODUODENOSCOPY (EGD) WITH PROPOFOL  N/A 01/21/2018   Procedure: ESOPHAGOGASTRODUODENOSCOPY (EGD) WITH PROPOFOL ;  Surgeon: Shaaron Lamar HERO, MD;  Location: [facility];  Service: Endoscopy;  Laterality: N/A;   HERNIA REPAIR     MALONEY DILATION N/A 01/21/2018   Procedure: AGAPITO DILATION;  Surgeon: Shaaron Lamar HERO, MD;  Location: [facility];  Service: Endoscopy;  Laterality: N/A;    OB  History   No obstetric history on file.      Home Medications    Prior to Admission medications   Medication Sig Start Date End Date Taking? Authorizing Provider  azithromycin  (ZITHROMAX ) 250 MG tablet May start if symptoms worsening over the next 4-5 days. Take first 2 tablets together, then 1 every day until finished. 10/23/23  Yes Stuart Vernell Norris, PA-C  predniSONE  (DELTASONE ) 20 MG tablet Take 2 tablets (40 mg total) by mouth daily with breakfast. 10/23/23  Yes Stuart Vernell Norris, PA-C  albuterol  (VENTOLIN  HFA) 108 (90 Base) MCG/ACT inhaler Inhale 2 puffs into the lungs every 6 (six) hours as needed. 04/13/23   Leath-Warren, Etta PARAS, NP  Budeson-Glycopyrrol-Formoterol  (BREZTRI  AEROSPHERE) 160-9-4.8 MCG/ACT AERO Take 2 puffs first thing in am and then another 2 puffs about 12 hours later. 01/08/23   Darlean Ozell NOVAK, MD  busPIRone  (BUSPAR ) 10 MG tablet Take 5 mg by mouth 3 (three) times daily.    [provider]  EPINEPHrine (PRIMATENE MIST) 0.125 MG/ACT AERO Inhale into the lungs.    [provider]  furosemide (LASIX) 20 MG tablet Take 20 mg by mouth daily.    [provider]  HYDROcodone-acetaminophen  (NORCO) 10-325 MG tablet Take 1 tablet by mouth every 4 (four) hours as needed. 11/22/21   [provider]  ipratropium-albuterol  (DUONEB) 0.5-2.5 (3) MG/3ML SOLN Take 3 mLs by nebulization every 6 (six) hours as needed (as needed for  shortness of breath or wheezing.). Patient not taking: Reported on 01/08/2023 11/15/22   [name], [name], MD  Multiple Vitamin (MULTIVITAMIN) tablet Take 1 tablet by mouth daily.    [provider]  promethazine -dextromethorphan (PROMETHAZINE -DM) 6.25-15 MG/5ML syrup Take 5 mLs by mouth 4 (four) times daily as needed. 04/13/23   Leath-Warren, Etta PARAS, NP  Pseudoephedrine-APAP-DM (THERAFLU-D PO) Take by mouth at bedtime.    [provider]  rOPINIRole  (REQUIP ) 0.5 MG tablet Take 0.5 mg by  mouth at bedtime.    [provider]  zolpidem  (AMBIEN ) 10 MG tablet Take 10 mg by mouth at bedtime as needed. 11/07/22   [provider]    Family History Family History  Problem Relation Age of Onset   Congestive Heart Failure Mother    Diabetes Maternal Grandmother    COPD Maternal Grandmother    Stroke Maternal Grandmother    COPD Maternal Grandfather    COPD Paternal Grandmother    COPD Paternal Grandfather    Colon cancer Neg Hx     Social History Social History   Tobacco Use   Smoking status: Every Day    Current packs/day: 1.00    Types: Cigarettes   Smokeless tobacco: Never  Vaping Use   Vaping status: Never Used  Substance Use Topics   Alcohol use: Not Currently   Drug use: No     Allergies   Patient has no known allergies.   Review of Systems Review of Systems PER HPI  Physical Exam Triage Vital Signs ED Triage Vitals  Encounter Vitals Group     BP 10/23/23 1352 118/67     Girls Systolic BP Percentile --      Girls Diastolic BP Percentile --      Boys Systolic BP Percentile --      Boys Diastolic BP Percentile --      Pulse Rate 10/23/23 1352 (!) 108     Resp 10/23/23 1352 (!) 22     Temp 10/23/23 1352 98.1 F (36.7 C)     Temp Source 10/23/23 1352 Oral     SpO2 10/23/23 1352 92 %     Weight --      Height --      Head Circumference --      Peak Flow --      Pain Score 10/23/23 1349 0     Pain Loc --      Pain Education --      Exclude from Growth Chart --    No data found.  Updated Vital Signs BP 118/67 (BP Location: Right Arm)   Pulse (!) 108   Temp 98.1 F (36.7 C) (Oral)   Resp (!) 22   LMP 02/21/2017 (Within Weeks)   SpO2 92%   Visual Acuity Right Eye Distance:   Left Eye Distance:   Bilateral Distance:    Right Eye Near:   Left Eye Near:    Bilateral Near:     Physical Exam Vitals and nursing note reviewed.  Constitutional:      Appearance: Normal appearance.  HENT:     Head: Atraumatic.      Right Ear: Tympanic membrane and external ear normal.     Left Ear: Tympanic membrane and external ear normal.     Nose: Nose normal.     Mouth/Throat:     Mouth: Mucous membranes are moist.     Pharynx: Posterior oropharyngeal erythema present.  Eyes:     Extraocular Movements: Extraocular movements  intact.     Conjunctiva/sclera: Conjunctivae normal.  Cardiovascular:     Rate and Rhythm: Regular rhythm. Tachycardia present.     Heart sounds: Normal heart sounds.  Pulmonary:     Effort: Pulmonary effort is normal.     Breath sounds: Wheezing present. No rales.  Musculoskeletal:        General: Normal range of motion.     Cervical back: Normal range of motion and neck supple.  Skin:    General: Skin is warm and dry.  Neurological:     Mental Status: She is alert and oriented to person, place, and time.  Psychiatric:        Mood and Affect: Mood normal.        Thought Content: Thought content normal.      UC Treatments / Results  Labs (all labs ordered are listed, but only abnormal results are displayed) Labs Reviewed - No data to display  EKG   Radiology No results found.  Procedures Procedures (including critical care time)  Medications Ordered in UC Medications  dexamethasone  (DECADRON ) injection 10 mg (10 mg Intramuscular Given 10/23/23 1416)    Initial Impression / Assessment and Plan / UC Course  I have reviewed the triage vital signs and the nursing notes.  Pertinent labs & imaging results that were available during my care of the patient were reviewed by me and considered in my medical decision making (see chart for details).     Tachycardic and tachypneic in triage, otherwise vital signs overall reassuring.  Oxygen  saturation borderline at 92% on room air however patient is speaking in full sentences and in no acute distress.  Will treat for severe asthma exacerbation with IM Decadron , prednisone  burst, continued inhaler regimen and nebulizer treatments  as needed.  Patient requesting Zithromax  send in case her symptoms are worsening, discussed not to start this unless worsening over the next 5 days or so.  Return for worsening symptoms.  Work note given.  Final Clinical Impressions(s) / UC Diagnoses   Final diagnoses:  Moderate persistent asthma with acute exacerbation  Tachycardia  Tachypnea   Discharge Instructions   None    ED Prescriptions     Medication Sig Dispense Auth. Provider   predniSONE  (DELTASONE ) 20 MG tablet Take 2 tablets (40 mg total) by mouth daily with breakfast. 10 tablet Stuart Vernell Norris, PA-C   azithromycin  (ZITHROMAX ) 250 MG tablet May start if symptoms worsening over the next 4-5 days. Take first 2 tablets together, then 1 every day until finished. 6 tablet Stuart Vernell Norris, [place]      PDMP not reviewed this encounter.   Stuart Vernell Norris, PA-C 10/23/23 [phone]

## 2023-10-23 NOTE — ED Triage Notes (Signed)
 Pt presents to UC for c/o shortness of breath, cough, fatigue x2 days. Worse at night. Hx asthma. Using inhaler yesterday and leftover prednisone .

## 2023-11-03 ENCOUNTER — Encounter: Payer: Self-pay | Admitting: Emergency Medicine

## 2023-11-03 ENCOUNTER — Ambulatory Visit
Admission: EM | Admit: 2023-11-03 | Discharge: 2023-11-03 | Disposition: A | Discharge status: Home / Self Care | Attending: Family Medicine | Admitting: Family Medicine

## 2023-11-03 DIAGNOSIS — J441 Chronic obstructive pulmonary disease with (acute) exacerbation: Secondary | ICD-10-CM

## 2023-11-03 MED ORDER — PREDNISONE 20 MG PO TABS: 40.0000 mg | ORAL_TABLET | Freq: Every day | ORAL | 0 refills | Status: DC

## 2023-11-03 MED ORDER — DEXAMETHASONE SODIUM PHOSPHATE 10 MG/ML IJ SOLN
10.0000 mg | Freq: Once | INTRAMUSCULAR | Status: AC
  Administered 2023-11-03: 10 mg via INTRAMUSCULAR

## 2023-11-03 MED ORDER — ALBUTEROL SULFATE HFA 108 (90 BASE) MCG/ACT IN AERS: 2.0000 | INHALATION_SPRAY | RESPIRATORY_TRACT | 0 refills | Status: AC | PRN

## 2023-11-03 MED ORDER — BREZTRI AEROSPHERE 160-9-4.8 MCG/ACT IN AERO: INHALATION_SPRAY | RESPIRATORY_TRACT | 0 refills | Status: AC

## 2023-11-03 NOTE — ED Provider Notes (Signed)
 [facility]    CSN: [id number] Arrival date & time: 11/03/23  1350      History   Chief Complaint No chief complaint on file.   HPI Ruth Dixon is a 53 y.o. female.   Patient presenting today following up on progressively worsening wheezing, shortness of breath.  Was seen on 10/23/2023 for the same, given Zithromax  and Decadron  and has been using her Breztri , home nebulizer treatments and albuterol  rescue inhaler with minimal relief.  Denies fever, chills, chest pain, abdominal pain, vomiting, diarrhea.  History of COPD and asthma.    Past Medical History:  Diagnosis Date   Asthma    Hypertension    IBS (irritable bowel syndrome)    Pleurisy    multiple times   Reflux     Patient Active Problem List   Diagnosis Date Noted   Asthmatic bronchitis , chronic (HCC) 01/08/2023   Leukocytosis 11/15/2022   Anxiety 11/15/2022   Restless leg 11/15/2022   Acute respiratory failure with hypoxia (HCC) 11/15/2022   Tobacco use disorder 11/15/2022   COPD exacerbation (HCC) 11/14/2022   Trigger ring finger of right hand 04/25/2019   Restrictive airway disease 01/14/2019   Cigarette smoker 01/06/2019   Rectal bleeding 01/07/2018   Abdominal pain 01/06/2018   Dysphagia 01/06/2018   Nausea with vomiting 01/06/2018   GERD (gastroesophageal reflux disease) 01/06/2018    Past Surgical History:  Procedure Laterality Date   BIOPSY  01/21/2018   Procedure: BIOPSY;  Surgeon: Shaaron Lamar HERO, MD;  Location: [facility];  Service: Endoscopy;;  es[place]ahgus/GE junction   COLONOSCOPY WITH PROPOFOL  N/A 01/21/2018   Procedure: COLONOSCOPY WITH PROPOFOL ;  Surgeon: Shaaron Lamar HERO, MD;  Location: [facility];  Service: Endoscopy;  Laterality: N/A;  12:45pm   ESOPHAGOGASTRODUODENOSCOPY (EGD) WITH PROPOFOL  N/A 01/21/2018   Procedure: ESOPHAGOGASTRODUODENOSCOPY (EGD) WITH PROPOFOL ;  Surgeon: Shaaron Lamar HERO, MD;  Location: [facility];  Service: Endoscopy;  Laterality: N/A;    HERNIA REPAIR     MALONEY DILATION N/A 01/21/2018   Procedure: AGAPITO DILATION;  Surgeon: Shaaron Lamar HERO, MD;  Location: [facility];  Service: Endoscopy;  Laterality: N/A;    OB History   No obstetric history on file.      Home Medications    Prior to Admission medications   Medication Sig Start Date End Date Taking? Authorizing Provider  predniSONE  (DELTASONE ) 20 MG tablet Take 2 tablets (40 mg total) by mouth daily with breakfast. 11/03/23  Yes Stuart Vernell Norris, PA-C  albuterol  (VENTOLIN  HFA) 108 (90 Base) MCG/ACT inhaler Inhale 2 puffs into the lungs every 4 (four) hours as needed. 11/03/23   Stuart Vernell Norris, PA-C  budesonide-glycopyrrolate-formoterol  (BREZTRI  AEROSPHERE) 160-9-4.8 MCG/ACT AERO inhaler Take 2 puffs first thing in am and then another 2 puffs about 12 hours later. 11/03/23   Stuart Vernell Norris, PA-C  busPIRone  (BUSPAR ) 10 MG tablet Take 5 mg by mouth 3 (three) times daily.    [provider]  EPINEPHrine (PRIMATENE MIST) 0.125 MG/ACT AERO Inhale into the lungs.    [provider]  furosemide (LASIX) 20 MG tablet Take 20 mg by mouth daily.    [provider]  HYDROcodone-acetaminophen  (NORCO) 10-325 MG tablet Take 1 tablet by mouth every 4 (four) hours as needed. 11/22/21   [provider]  ipratropium-albuterol  (DUONEB) 0.5-2.5 (3) MG/3ML SOLN Take 3 mLs by nebulization every 6 (six) hours as needed (as needed for shortness of breath or wheezing.). Patient not taking: Reported  on 01/08/2023 11/15/22   [name], [name], MD  Multiple Vitamin (MULTIVITAMIN) tablet Take 1 tablet by mouth daily.    [provider]  predniSONE  (DELTASONE ) 20 MG tablet Take 2 tablets (40 mg total) by mouth daily with breakfast. 10/23/23   Stuart Vernell Norris, PA-C  promethazine -dextromethorphan (PROMETHAZINE -DM) 6.25-15 MG/5ML syrup Take 5 mLs by mouth 4 (four) times daily as needed. 04/13/23   Leath-Warren, Etta PARAS, NP   Pseudoephedrine-APAP-DM (THERAFLU-D PO) Take by mouth at bedtime.    [provider]  rOPINIRole  (REQUIP ) 0.5 MG tablet Take 0.5 mg by mouth at bedtime.    [provider]  zolpidem  (AMBIEN ) 10 MG tablet Take 10 mg by mouth at bedtime as needed. 11/07/22   [provider]    Family History Family History  Problem Relation Age of Onset   Congestive Heart Failure Mother    Diabetes Maternal Grandmother    COPD Maternal Grandmother    Stroke Maternal Grandmother    COPD Maternal Grandfather    COPD Paternal Grandmother    COPD Paternal Grandfather    Colon cancer Neg Hx     Social History Social History   Tobacco Use   Smoking status: Every Day    Current packs/day: 1.00    Types: Cigarettes   Smokeless tobacco: Never  Vaping Use   Vaping status: Never Used  Substance Use Topics   Alcohol use: Not Currently   Drug use: No     Allergies   Patient has no known allergies.   Review of Systems Review of Systems Per HPI  Physical Exam Triage Vital Signs ED Triage Vitals  Encounter Vitals Group     BP 11/03/23 1358 118/67     Girls Systolic BP Percentile --      Girls Diastolic BP Percentile --      Boys Systolic BP Percentile --      Boys Diastolic BP Percentile --      Pulse Rate 11/03/23 1358 99     Resp 11/03/23 1358 (!) 24     Temp 11/03/23 1358 98.2 F (36.8 C)     Temp Source 11/03/23 1358 Oral     SpO2 11/03/23 1358 91 %     Weight --      Height --      Head Circumference --      Peak Flow --      Pain Score 11/03/23 1402 0     Pain Loc --      Pain Education --      Exclude from Growth Chart --    No data found.  Updated Vital Signs BP 118/67 (BP Location: Right Arm)   Pulse 99   Temp 98.2 F (36.8 C) (Oral)   Resp (!) 24   LMP 02/21/2017 (Within Weeks)   SpO2 91%   Visual Acuity Right Eye Distance:   Left Eye Distance:   Bilateral Distance:    Right Eye Near:   Left Eye Near:    Bilateral Near:      Physical Exam Vitals and nursing note reviewed.  Constitutional:      Appearance: Normal appearance.  HENT:     Head: Atraumatic.     Right Ear: Tympanic membrane and external ear normal.     Left Ear: Tympanic membrane and external ear normal.     Nose: No congestion.     Mouth/Throat:     Mouth: Mucous membranes are moist.     Pharynx: No  posterior oropharyngeal erythema.  Eyes:     Extraocular Movements: Extraocular movements intact.     Conjunctiva/sclera: Conjunctivae normal.  Cardiovascular:     Rate and Rhythm: Normal rate and regular rhythm.     Heart sounds: Normal heart sounds.  Pulmonary:     Breath sounds: Wheezing present.     Comments: Respiratory effort labored, unable to complete full sentences without gasping for air.  Audible wheezes Musculoskeletal:        General: Normal range of motion.     Cervical back: Normal range of motion and neck supple.  Skin:    General: Skin is warm and dry.  Neurological:     Mental Status: She is alert and oriented to person, place, and time.  Psychiatric:        Mood and Affect: Mood normal.        Thought Content: Thought content normal.      UC Treatments / Results  Labs (all labs ordered are listed, but only abnormal results are displayed) Labs Reviewed - No data to display  EKG   Radiology No results found.  Procedures Procedures (including critical care time)  Medications Ordered in UC Medications  dexamethasone  (DECADRON ) injection 10 mg (10 mg Intramuscular Given 11/03/23 1423)    Initial Impression / Assessment and Plan / UC Course  I have reviewed the triage vital signs and the nursing notes.  Pertinent labs & imaging results that were available during my care of the patient were reviewed by me and considered in my medical decision making (see chart for details).     Oxygen  saturation ranging from 91% to 95% on room air.  Consistent with COPD and asthma exacerbation.  Will treat with another  round of IM Decadron , prednisone , and refill her albuterol  and Breztri .  Follow-up with pulmonology if not fully resolving.  Work note given.  Return for worsening symptoms.  Final Clinical Impressions(s) / UC Diagnoses   Final diagnoses:  COPD exacerbation [facility])   Discharge Instructions   None    ED Prescriptions     Medication Sig Dispense Auth. Provider   predniSONE  (DELTASONE ) 20 MG tablet Take 2 tablets (40 mg total) by mouth daily with breakfast. 10 tablet Stuart Vernell Norris, PA-C   albuterol  (VENTOLIN  HFA) 108 (90 Base) MCG/ACT inhaler Inhale 2 puffs into the lungs every 4 (four) hours as needed. 18 g Stuart Vernell Norris, PA-C   budesonide-glycopyrrolate-formoterol  (BREZTRI  AEROSPHERE) 160-9-4.8 MCG/ACT AERO inhaler Take 2 puffs first thing in am and then another 2 puffs about 12 hours later. 10.7 g Stuart Vernell Norris, [place]      PDMP not reviewed this encounter.   Stuart Vernell Norris, [place] 11/03/23 1538

## 2023-11-03 NOTE — ED Triage Notes (Signed)
 Was seen on 9/19 for SOB and cough.  States started to feel better.  Went to work yesterday and started to become more SOB.  Feels tired.  Has been using her breathing machine and inhaler.  States her breathing machine quick working yesterday.

## 2023-11-12 ENCOUNTER — Ambulatory Visit: Payer: Self-pay | Admitting: Family Medicine

## 2023-11-12 ENCOUNTER — Encounter: Payer: Self-pay | Admitting: Family Medicine

## 2023-11-12 VITALS — BP 117/65 | HR 106 | Temp 97.8°F | Ht 68.0 in | Wt 146.0 lb

## 2023-11-12 DIAGNOSIS — G2581 Restless legs syndrome: Secondary | ICD-10-CM | POA: Diagnosis not present

## 2023-11-12 DIAGNOSIS — G8929 Other chronic pain: Secondary | ICD-10-CM | POA: Diagnosis not present

## 2023-11-12 DIAGNOSIS — Z7689 Persons encountering health services in other specified circumstances: Secondary | ICD-10-CM | POA: Insufficient documentation

## 2023-11-12 DIAGNOSIS — M51362 Other intervertebral disc degeneration, lumbar region with discogenic back pain and lower extremity pain: Secondary | ICD-10-CM

## 2023-11-12 DIAGNOSIS — I10 Essential (primary) hypertension: Secondary | ICD-10-CM

## 2023-11-12 DIAGNOSIS — F17210 Nicotine dependence, cigarettes, uncomplicated: Secondary | ICD-10-CM | POA: Diagnosis not present

## 2023-11-12 DIAGNOSIS — Z1231 Encounter for screening mammogram for malignant neoplasm of breast: Secondary | ICD-10-CM

## 2023-11-12 DIAGNOSIS — F419 Anxiety disorder, unspecified: Secondary | ICD-10-CM

## 2023-11-12 DIAGNOSIS — K219 Gastro-esophageal reflux disease without esophagitis: Secondary | ICD-10-CM | POA: Diagnosis not present

## 2023-11-12 DIAGNOSIS — Z79899 Other long term (current) drug therapy: Secondary | ICD-10-CM

## 2023-11-12 DIAGNOSIS — J4489 Other specified chronic obstructive pulmonary disease: Secondary | ICD-10-CM

## 2023-11-12 NOTE — Assessment & Plan Note (Signed)
 Followed by Pulmonology Dr Darlean, see note below. Not using inhaler, counseled on medication compliance and encouraed smoking cessation.   Pulmonology note 01/08/2023 for reference: Active smoker with lifelong asthma and freq exac 2024  - 01/08/2023  After extensive coaching inhaler device,  effectiveness =    75% (short ti) > breztri  2bid x 6 weeks then regroup   Will need pfts for GOLD staging but clinically  Group D (now reclassified as E) in terms of symptom/risk and laba/lama/ICS  therefore appropriate rx at this point >>>  breztri  and much more approp saba:   Re SABA :  I spent extra time with pt today reviewing appropriate use of albuterol  for prn use on exertion with the following points: 1) saba is for relief of sob that does not improve by walking a slower pace or resting but rather if the pt does not improve after trying this first. 2) If the pt is convinced, as many are, that saba helps recover from activity faster then it's easy to tell if this is the case by re-challenging : ie stop, take the inhaler, then p 5 minutes try the exact same activity (intensity of workload) that just caused the symptoms and see if they are substantially diminished or not after saba 3) if there is an activity that reproducibly causes the symptoms, try the saba 15 min before the activity on alternate days    If in fact the saba really does help, then fine to continue to use it prn but advised may need to look closer at the maintenance regimen being used to achieve better control of airways disease with exertion.    >>> f/u 6 weeks with all meds in hand using a trust but verify approach to confirm accurate Medication  Reconciliation The principal here is that until we are certain that the  patients are doing what we've asked, it makes no sense to ask them to do more.

## 2023-11-12 NOTE — Assessment & Plan Note (Signed)
 Diet controlled. Recommend heart healthy diet such as Mediterranean diet with whole grains, fruits, vegetable, fish, lean meats, nuts, and olive oil. Limit salt. Encouraged moderate walking, 3-5 times/week for 30-50 minutes each session. Aim for at least 150 minutes.week. Goal should be pace of 3 miles/hours, or walking 1.5 miles in 30 minutes. Avoid tobacco products. Avoid excess alcohol. Take medications as prescribed and bring medications and blood pressure log with cuff to each office visit. Seek medical care for chest pain, palpitations, shortness of breath with exertion, dizziness/lightheadedness, vision changes, recurrent headaches, or swelling of extremities.

## 2023-11-12 NOTE — Patient Instructions (Signed)
 It was great to meet you today and I'm excited to have you join the [facility] practice. I hope you had a positive experience today! If you feel so inclined, please feel free to recommend our practice to friends and family. Jeoffrey Barrio, FNP-C

## 2023-11-12 NOTE — Assessment & Plan Note (Signed)
 Today we reviewed medical history, current concerns, and health maintenance items. Mammogram ordered, will return for PAP, labs today, recommended vaccines when well.

## 2023-11-12 NOTE — Assessment & Plan Note (Signed)
 Continue buspar  5mg  BID. Discussed alternatives, she will consider.

## 2023-11-12 NOTE — Assessment & Plan Note (Signed)
 Continue Requip  0.5mg  daily.

## 2023-11-12 NOTE — Assessment & Plan Note (Signed)
 Diet controlled. Elevated HOB if needed and avoid lying down 2-3 hours after eating, avoid coffee, alcohol, chocolate, fatty foods, citrus, carbonated beverages, spicy foods, late meals, and smoking. Return to office if symptoms return or worsen and seek medical care for difficulty swallowing, bleeding, anemia, weight loss, or recurrent vomiting.

## 2023-11-12 NOTE — Assessment & Plan Note (Signed)
 Chronic pain managed with hydrocodone-acetaminophen  10-325mg  6 times daily. Referral to pain management. Discussed risks of long term opioid use and advised against. She is willing to seek alternatives in the future. States she was worked up in past and not a surgical candidate. Discussed alternatives and will consider referral to [facility]. Controlled substance agreement signed and drug screen completed today. Will fill until established with pain clinic.

## 2023-11-12 NOTE — Progress Notes (Signed)
 New Patient Office Visit  Subjective    Patient ID: Ruth Dixon, female    DOB: [birth year]  Age: 53 y.o. MRN: [id number]  CC:  Chief Complaint  Patient presents with   Establish Care    HPI Ruth Dixon presents to establish care. Oriented to practice routines and expectations. Has been seeing PCP regularly. PMH includes asthma, COPD, HTN, IBS, GERD. Ruth Dixon is also followed by pulmonology.  Asthma/COPD: 1ppd  x 40 years, not ready to quit, Breztri  2 puffs BID, Albuterol  PRN, not using  Anxiety: Buspar  5mg  BID  RLS: Requip  0.5mg  nightly  HTN: diet controlled  GERD: diet controlled  Chronic pain syndrome and DDD: Hydrocodone-Acetaminophone 10-325mg  6 times a day, not a surgical candidate according to patient, works as an OT  Discussed the use of AI scribe software for clinical note transcription with the patient, who gave verbal consent to proceed.  History of Present Illness Ruth Dixon is a 53 year old female who presents to establish care.   She experiences chronic lower back pain due to degenerative disc disease. Her occupation as an occupational therapy assistant involves significant physical activity, including the use of a sliding board and Hoyer lifts, which exacerbates her symptoms. She takes hydrocodone, 60 mg daily, for pain management, though not consistently. Imaging was done in the past, and surgery was discussed but is not currently feasible. She has tried non-pharmacological interventions such as electrostimulation and heat therapy.  She has a history of smoking for nearly 40 years, currently smoking about a pack a day. She has been diagnosed with COPD and asthma, using Breztri  twice daily and albuterol  as a rescue inhaler, though she does not use these medications regularly unless experiencing a flare-up. She reports missing work due to respiratory issues, with oxygen  levels dropping into the 80s during flare-ups.  Her sleep is significantly  disrupted, with longstanding issues since her early twenties. She has difficulty sleeping despite trying various medications, including Ambien  and melatonin, without success. She describes her sleep pattern as 'go, go, go,' and notes that she does not achieve restful sleep.  Her diet is irregular, typically eating only once a day, which she attributes to her busy schedule. She does not eat breakfast or lunch regularly, often consuming a small snack or meal late at night. She acknowledges the need for better self-care, including regular meals and sleep.  No current issues with IBS, stating it is 'gone,' and her blood pressure and acid reflux are diet-controlled. No history of prediabetes, diabetes, thyroid problems, or vitamin deficiencies.     Outpatient Encounter Medications as of 11/12/2023  Medication Sig   albuterol  (VENTOLIN  HFA) 108 (90 Base) MCG/ACT inhaler Inhale 2 puffs into the lungs every 4 (four) hours as needed.   budesonide-glycopyrrolate-formoterol  (BREZTRI  AEROSPHERE) 160-9-4.8 MCG/ACT AERO inhaler Take 2 puffs first thing in am and then another 2 puffs about 12 hours later.   busPIRone  (BUSPAR ) 10 MG tablet Take 5 mg by mouth 3 (three) times daily.   EPINEPHrine (PRIMATENE MIST) 0.125 MG/ACT AERO Inhale into the lungs.   furosemide (LASIX) 20 MG tablet Take 20 mg by mouth daily.   HYDROcodone-acetaminophen  (NORCO) 10-325 MG tablet Take 1 tablet by mouth every 4 (four) hours as needed.   ipratropium-albuterol  (DUONEB) 0.5-2.5 (3) MG/3ML SOLN Take 3 mLs by nebulization every 6 (six) hours as needed (as needed for shortness of breath or wheezing.). (Patient not taking: Reported on 01/08/2023)   Multiple Vitamin (MULTIVITAMIN) tablet Take 1  tablet by mouth daily.   predniSONE  (DELTASONE ) 20 MG tablet Take 2 tablets (40 mg total) by mouth daily with breakfast.   predniSONE  (DELTASONE ) 20 MG tablet Take 2 tablets (40 mg total) by mouth daily with breakfast.    promethazine -dextromethorphan (PROMETHAZINE -DM) 6.25-15 MG/5ML syrup Take 5 mLs by mouth 4 (four) times daily as needed.   Pseudoephedrine-APAP-DM (THERAFLU-D PO) Take by mouth at bedtime.   rOPINIRole  (REQUIP ) 0.5 MG tablet Take 0.5 mg by mouth at bedtime.   zolpidem  (AMBIEN ) 10 MG tablet Take 10 mg by mouth at bedtime as needed.   No facility-administered encounter medications on file as of 11/12/2023.    Past Medical History:  Diagnosis Date   Asthma    Hypertension    IBS (irritable bowel syndrome)    Pleurisy    multiple times   Reflux     Past Surgical History:  Procedure Laterality Date   BIOPSY  01/21/2018   Procedure: BIOPSY;  Surgeon: Shaaron Lamar HERO, MD;  [facility]: [facility];  Service: Endoscopy;;  esopahgus/GE junction   COLONOSCOPY WITH PROPOFOL  N/A 01/21/2018   Procedure: COLONOSCOPY WITH PROPOFOL ;  Surgeon: Shaaron Lamar HERO, MD;  Location: [facility];  Service: Endoscopy;  Laterality: N/A;  12:45pm   ESOPHAGOGASTRODUODENOSCOPY (EGD) WITH PROPOFOL  N/A 01/21/2018   Procedure: ESOPHAGOGASTRODUODENOSCOPY (EGD) WITH PROPOFOL ;  Surgeon: Shaaron Lamar HERO, MD;  Location: [facility];  Service: Endoscopy;  Laterality: N/A;   HERNIA REPAIR     MALONEY DILATION N/A 01/21/2018   Procedure: AGAPITO DILATION;  Surgeon: Shaaron Lamar HERO, MD;  Location: [facility];  Service: Endoscopy;  Laterality: N/A;    Family History  Problem Relation Age of Onset   Congestive Heart Failure Mother    Diabetes Maternal Grandmother    COPD Maternal Grandmother    Stroke Maternal Grandmother    COPD Maternal Grandfather    COPD Paternal Grandmother    COPD Paternal Grandfather    Colon cancer Neg Hx     Social History   Socioeconomic History   Marital status: Divorced    Spouse name: Not on file   Number of children: Not on file   Years of education: Not on file   Highest education level: Not on file  Occupational History   Not on file  Tobacco Use   Smoking status: Every  Day    Current packs/day: 1.00    Types: Cigarettes   Smokeless tobacco: Never  Vaping Use   Vaping status: Never Used  Substance and Sexual Activity   Alcohol use: Not Currently   Drug use: No   Sexual activity: Yes    Birth control/protection: Post-menopausal  Other Topics Concern   Not on file  Social History Narrative   Not on file   Social Drivers of Health   Financial Resource Strain: Not on file  Food Insecurity: No Food Insecurity (11/14/2022)   Hunger Vital Sign    Worried About Running Out of Food in the Last Year: Never true    Ran Out of Food in the Last Year: Never true  Transportation Needs: No Transportation Needs (11/14/2022)   PRAPARE - Administrator, Civil Service (Medical): No    Lack of Transportation (Non-Medical): No  Physical Activity: Not on file  Stress: Not on file  Social Connections: Not on file  Intimate Partner Violence: Not At Risk (11/14/2022)   Humiliation, Afraid, Rape, and Kick questionnaire    Fear of Current or Ex-Partner: No  Emotionally Abused: No    Physically Abused: No    Sexually Abused: No    Review of Systems  All other systems reviewed and are negative.       Objective    BP 117/65   Pulse (!) 106   Temp 97.8 F (36.6 C)   Ht 5' 8 (1.727 m)   Wt 146 lb (66.2 kg)   LMP 02/21/2017 (Within Weeks)   SpO2 97%   BMI 22.20 kg/m   Physical Exam Vitals and nursing note reviewed.  Constitutional:      Appearance: Normal appearance. She is normal weight.  HENT:     Head: Normocephalic and atraumatic.  Cardiovascular:     Rate and Rhythm: Normal rate and regular rhythm.     Pulses: Normal pulses.     Heart sounds: Normal heart sounds.  Pulmonary:     Effort: Pulmonary effort is normal.     Breath sounds: Rhonchi present.  Skin:    General: Skin is warm and dry.  Neurological:     General: No focal deficit present.     Mental Status: She is alert and oriented to person, place, and time. Mental  status is at baseline.  Psychiatric:        Mood and Affect: Mood normal.        Behavior: Behavior normal.        Thought Content: Thought content normal.        Judgment: Judgment normal.         Assessment & Plan:   Problem List Items Addressed This Visit     GERD (gastroesophageal reflux disease)   Diet controlled. Elevated HOB if needed and avoid lying down 2-3 hours after eating, avoid coffee, alcohol, chocolate, fatty foods, citrus, carbonated beverages, spicy foods, late meals, and smoking. Return to office if symptoms return or worsen and seek medical care for difficulty swallowing, bleeding, anemia, weight loss, or recurrent vomiting.        Relevant Orders   CBC with Differential/Platelet   Comprehensive metabolic panel with GFR   Lipid panel   Anxiety   Continue buspar  5mg  BID. Discussed alternatives, she will consider.       Restless leg   Continue Requip  0.5mg  daily.       RESOLVED: Cigarette smoker   Asthmatic bronchitis , chronic (HCC)   Followed by Pulmonology Dr Darlean, see note below. Not using inhaler, counseled on medication compliance and encouraed smoking cessation.   Pulmonology note 01/08/2023 for reference: Active smoker with lifelong asthma and freq exac 2024  - 01/08/2023  After extensive coaching inhaler device,  effectiveness =    75% (short ti) > breztri  2bid x 6 weeks then regroup   Will need pfts for GOLD staging but clinically  Group D (now reclassified as E) in terms of symptom/risk and laba/lama/ICS  therefore appropriate rx at this point >>>  breztri  and much more approp saba:   Re SABA :  I spent extra time with pt today reviewing appropriate use of albuterol  for prn use on exertion with the following points: 1) saba is for relief of sob that does not improve by walking a slower pace or resting but rather if the pt does not improve after trying this first. 2) If the pt is convinced, as many are, that saba helps recover from activity  faster then it's easy to tell if this is the case by re-challenging : ie stop, take the inhaler, then p 5 minutes  try the exact same activity (intensity of workload) that just caused the symptoms and see if they are substantially diminished or not after saba 3) if there is an activity that reproducibly causes the symptoms, try the saba 15 min before the activity on alternate days    If in fact the saba really does help, then fine to continue to use it prn but advised may need to look closer at the maintenance regimen being used to achieve better control of airways disease with exertion.    >>> f/u 6 weeks with all meds in hand using a trust but verify approach to confirm accurate Medication  Reconciliation The principal here is that until we are certain that the  patients are doing what we've asked, it makes no sense to ask them to do more.       Cigarette nicotine  dependence without complication   3-5 minute discussion regarding the harms of tobacco use, the benefits of cessation, and methods of cessation. Discussed that there are medication options to help with cessation. Provided printed education on steps to quit smoking. Patient is not ready to try a medication to help.       Encounter to establish care with new provider - Primary   Today we reviewed medical history, current concerns, and health maintenance items. Mammogram ordered, will return for PAP, labs today, recommended vaccines when well.       Relevant Orders   MM DIGITAL SCREENING BILATERAL   CBC with Differential/Platelet   Comprehensive metabolic panel with GFR   Lipid panel   DRUG MONITOR, PANEL 4, W/CONF, URINE   PRESCRIBED DRUGS,MEDMATCH(R)   Other chronic pain   Relevant Orders   Ambulatory referral to Pain Clinic   DRUG MONITOR, PANEL 4, W/CONF, URINE   PRESCRIBED DRUGS,MEDMATCH(R)   Degeneration of intervertebral disc of lumbar region with discogenic back pain and lower extremity pain   Chronic pain managed with  hydrocodone-acetaminophen  10-325mg  6 times daily. Referral to pain management. Discussed risks of long term opioid use and advised against. She is willing to seek alternatives in the future. States she was worked up in past and not a surgical candidate. Discussed alternatives and will consider referral to [facility]. Controlled substance agreement signed and drug screen completed today. Will fill until established with pain clinic.       Relevant Orders   Ambulatory referral to Pain Clinic   Hypertension   Diet controlled. Recommend heart healthy diet such as Mediterranean diet with whole grains, fruits, vegetable, fish, lean meats, nuts, and olive oil. Limit salt. Encouraged moderate walking, 3-5 times/week for 30-50 minutes each session. Aim for at least 150 minutes.week. Goal should be pace of 3 miles/hours, or walking 1.5 miles in 30 minutes. Avoid tobacco products. Avoid excess alcohol. Take medications as prescribed and bring medications and blood pressure log with cuff to each office visit. Seek medical care for chest pain, palpitations, shortness of breath with exertion, dizziness/lightheadedness, vision changes, recurrent headaches, or swelling of extremities.       Relevant Orders   CBC with Differential/Platelet   Comprehensive metabolic panel with GFR   Lipid panel   Other Visit Diagnoses       Encounter for screening mammogram for malignant neoplasm of breast       Relevant Orders   MM DIGITAL SCREENING BILATERAL     Controlled substance agreement signed       Relevant Orders   DRUG MONITOR, PANEL 4, W/CONF, URINE  PRESCRIBED DRUGS,MEDMATCH(R)       Return for ASAP PAP.   Jeoffrey GORMAN Barrio, FNP

## 2023-11-12 NOTE — Assessment & Plan Note (Signed)
 3-5 minute discussion regarding the harms of tobacco use, the benefits of cessation, and methods of cessation. Discussed that there are medication options to help with cessation. Provided printed education on steps to quit smoking. Patient is not ready to try a medication to help.

## 2023-11-13 LAB — COMPREHENSIVE METABOLIC PANEL WITH GFR
AG Ratio: 1.6 (calc) (ref 1.0–2.5)
ALT: 16 U/L (ref 6–29)
AST: 15 U/L (ref 10–35)
Albumin: 4.1 g/dL (ref 3.6–5.1)
Alkaline phosphatase (APISO): 41 U/L (ref 37–153)
BUN: 12 mg/dL (ref 7–25)
CO2: 31 mmol/L (ref 20–32)
Calcium: 9.6 mg/dL (ref 8.6–10.4)
Chloride: 105 mmol/L (ref 98–110)
Creat: 0.61 mg/dL (ref 0.50–1.03)
Globulin: 2.6 g/dL (ref 1.9–3.7)
Glucose, Bld: 90 mg/dL (ref 65–99)
Potassium: 4.3 mmol/L (ref 3.5–5.3)
Sodium: 140 mmol/L (ref 135–146)
Total Bilirubin: 0.4 mg/dL (ref 0.2–1.2)
Total Protein: 6.7 g/dL (ref 6.1–8.1)
eGFR: 106 mL/min/1.73m2 (ref >=60)

## 2023-11-13 LAB — LIPID PANEL
Cholesterol: 200 mg/dL — ABNORMAL HIGH (ref <200)
HDL: 91 mg/dL (ref >=50)
LDL Cholesterol (Calc): 91 mg/dL
Non-HDL Cholesterol (Calc): 109 mg/dL (ref <130)
Total CHOL/HDL Ratio: 2.2 (calc) (ref <5.0)
Triglycerides: 85 mg/dL (ref <150)

## 2023-11-13 LAB — CBC WITH DIFFERENTIAL/PLATELET
Absolute Lymphocytes: 2754 {cells}/uL (ref 850–3900)
Absolute Monocytes: 843 {cells}/uL (ref 200–950)
Basophils Absolute: 69 {cells}/uL (ref 0–200)
Basophils Relative: 0.7 %
Eosinophils Absolute: 137 {cells}/uL (ref 15–500)
Eosinophils Relative: 1.4 %
HCT: 41.3 % (ref 35.0–45.0)
Hemoglobin: 13.4 g/dL (ref 11.7–15.5)
MCH: 26.9 pg — ABNORMAL LOW (ref 27.0–33.0)
MCHC: 32.4 g/dL (ref 32.0–36.0)
MCV: 82.8 fL (ref 80.0–100.0)
MPV: 8.2 fL (ref 7.5–12.5)
Monocytes Relative: 8.6 %
Neutro Abs: 5998 {cells}/uL (ref 1500–7800)
Neutrophils Relative %: 61.2 %
Platelets: 524 Thousand/uL — ABNORMAL HIGH (ref 140–400)
RBC: 4.99 Million/uL (ref 3.80–5.10)
RDW: 13.2 % (ref 11.0–15.0)
Total Lymphocyte: 28.1 %
WBC: 9.8 Thousand/uL (ref 3.8–10.8)

## 2023-11-16 ENCOUNTER — Other Ambulatory Visit: Payer: Self-pay | Admitting: Family Medicine

## 2023-11-16 NOTE — Telephone Encounter (Unsigned)
 Copied from CRM [id number]. Topic: Clinical - Medication Refill >> Nov 16, 2023  9:14 AM Rosina BIRCH wrote: Medication: hydrocodone The patient was seeing MD fusco and his ID is no longer active and it deleted all refills so the patient could not get  a refill on last saturday   Has the patient contacted their pharmacy? Yes (Agent: If no, request that the patient contact the pharmacy for the refill. If patient does not wish to contact the pharmacy document the reason why and proceed with request.) (Agent: If yes, when and what did the pharmacy advise?)  This is the patient's preferred pharmacy:  [facility] [phone] - [place], [place] - [address] AT [facility] [address] [place] [address] Phone: [phone] Fax: [phone]  Is this the correct pharmacy for this prescription? Yes If no, delete pharmacy and type the correct one.   Has the prescription been filled recently? No  Is the patient out of the medication? Yes  Has the patient been seen for an appointment in the last year OR does the patient have an upcoming appointment? Yes  Can we respond through MyChart? No  Agent: Please be advised that Rx refills may take up to 3 business days. We ask that you follow-up with your pharmacy.

## 2023-11-16 NOTE — Addendum Note (Signed)
 Addended by: CORINNA BRISKER R on: 11/16/2023 04:50 PM   Modules accepted: Orders

## 2023-11-17 LAB — DRUG MONITOR, PANEL 4, W/CONF, URINE
Amphetamines: NEGATIVE ng/mL (ref <500)
Barbiturates: NEGATIVE ng/mL (ref <300)
Benzodiazepines: NEGATIVE ng/mL (ref <100)
Cocaine Metabolite: NEGATIVE ng/mL (ref <150)
Codeine: NEGATIVE ng/mL (ref <50)
Creatinine: 18.1 mg/dL — ABNORMAL LOW (ref >=20.0)
Hydrocodone: 769 ng/mL — ABNORMAL HIGH (ref <50)
Hydromorphone: 478 ng/mL — ABNORMAL HIGH (ref <50)
Methadone Metabolite: NEGATIVE ng/mL (ref <100)
Morphine: NEGATIVE ng/mL (ref <50)
Norhydrocodone: 800 ng/mL — ABNORMAL HIGH (ref <50)
Opiates: POSITIVE ng/mL — AB (ref <100)
Oxidant: NEGATIVE ug/mL (ref <200)
Oxycodone: NEGATIVE ng/mL (ref <100)
Phencyclidine: NEGATIVE ng/mL (ref <25)
Specific Gravity: 1.005 (ref >=1.003)
pH: 5 (ref 4.5–9.0)

## 2023-11-17 LAB — PRESCRIBED DRUGS,MEDMATCH(R)

## 2023-11-17 LAB — DM TEMPLATE

## 2023-11-17 NOTE — Telephone Encounter (Signed)
 Requested medications are due for refill today.  unsure  Requested medications are on the active medications list.  yes  Last refill. 11/22/2021   Future visit scheduled.   no  Notes to clinic.  Refill refusal not delegated.    Requested Prescriptions  Pending Prescriptions Disp Refills   HYDROcodone-acetaminophen  (NORCO) 10-325 MG tablet 30 tablet 0    Sig: Take 1 tablet by mouth every 4 (four) hours as needed.     Not Delegated - Analgesics:  Opioid Agonist Combinations Failed - 11/17/2023  5:00 PM      Failed - This refill cannot be delegated      Failed - Urine Drug Screen completed in last 360 days      Passed - Valid encounter within last 3 months    Recent Outpatient Visits           5 days ago Encounter to establish care with new provider   [facility] Kayla Jeoffrey RAMAN, FNP

## 2023-11-18 ENCOUNTER — Telehealth: Payer: Self-pay | Admitting: Family Medicine

## 2023-11-18 MED ORDER — HYDROCODONE-ACETAMINOPHEN 10-325 MG PO TABS: 1.0000 | ORAL_TABLET | ORAL | 0 refills | Status: DC | PRN

## 2023-11-18 NOTE — Telephone Encounter (Signed)
 Copied from CRM #[id number]. Topic: Clinical - Prescription Issue >> Nov 18, 2023 12:09 PM Eva FALCON wrote: Reason for CRM: Patient fiance Venetia is calling in to check on status  HYDROcodone-Acetaminophen  (1 tablet Oral Every 4 hours PRN). I see it says is pending, I advised about the 3 business days. But was thinking they did something wrong. Please advise if there is any further questions.

## 2023-11-19 ENCOUNTER — Telehealth: Payer: Self-pay

## 2023-11-19 ENCOUNTER — Other Ambulatory Visit: Payer: Self-pay | Admitting: Family Medicine

## 2023-11-19 MED ORDER — HYDROCODONE-ACETAMINOPHEN 10-325 MG PO TABS: 1.0000 | ORAL_TABLET | ORAL | 0 refills | Status: DC | PRN

## 2023-11-19 NOTE — Telephone Encounter (Signed)
 Pt. Family has been notified of medication refill . Answered questions about medication refill protocol for office and how to make sure medications get refilled on time.

## 2023-11-19 NOTE — Telephone Encounter (Signed)
 Copied from CRM [id number]. Topic: Clinical - Prescription Issue >> Nov 19, 2023  1:35 PM Amy B wrote: Reason for CRM: Patient states there is a mistake in her prescription of hydrocodone.  She requires a quantity of 180 and was only sent a quantity of 30.  She takes 6 pills per day.  Patient requests a call back at [phone]

## 2023-11-20 NOTE — Telephone Encounter (Signed)
 Reviewed DPR, pt fiance, Christopher, informed.

## 2023-11-23 ENCOUNTER — Other Ambulatory Visit: Payer: Self-pay | Admitting: Family Medicine

## 2023-12-06 ENCOUNTER — Emergency Department (HOSPITAL_COMMUNITY)

## 2023-12-06 ENCOUNTER — Encounter (HOSPITAL_COMMUNITY): Payer: Self-pay

## 2023-12-06 ENCOUNTER — Emergency Department (HOSPITAL_COMMUNITY)
Admission: EM | Admit: 2023-12-06 | Discharge: 2023-12-06 | Disposition: A | Discharge status: Home / Self Care | Attending: Emergency Medicine | Admitting: Emergency Medicine

## 2023-12-06 ENCOUNTER — Other Ambulatory Visit: Payer: Self-pay

## 2023-12-06 DIAGNOSIS — M5431 Sciatica, right side: Secondary | ICD-10-CM | POA: Insufficient documentation

## 2023-12-06 DIAGNOSIS — M545 Low back pain, unspecified: Secondary | ICD-10-CM | POA: Diagnosis present

## 2023-12-06 HISTORY — DX: Unspecified osteoarthritis, unspecified site: M19.90

## 2023-12-06 HISTORY — DX: Spinal stenosis, site unspecified: M48.00

## 2023-12-06 MED ORDER — HYDROMORPHONE HCL 1 MG/ML IJ SOLN
1.0000 mg | Freq: Once | INTRAMUSCULAR | Status: AC
  Administered 2023-12-06: 1 mg via INTRAMUSCULAR
  Filled 2023-12-06: qty 1

## 2023-12-06 MED ORDER — METHYLPREDNISOLONE SODIUM SUCC 125 MG IJ SOLR
125.0000 mg | Freq: Once | INTRAMUSCULAR | Status: AC
  Administered 2023-12-06: 125 mg via INTRAMUSCULAR
  Filled 2023-12-06: qty 2

## 2023-12-06 MED ORDER — OXYCODONE-ACETAMINOPHEN 5-325 MG PO TABS: 1.0000 | ORAL_TABLET | Freq: Four times a day (QID) | ORAL | 0 refills | Status: DC | PRN

## 2023-12-06 MED ORDER — PREDNISONE 10 MG PO TABS: 20.0000 mg | ORAL_TABLET | Freq: Every day | ORAL | 0 refills | Status: DC

## 2023-12-06 NOTE — Discharge Instructions (Addendum)
 Follow-up at [facility] neurosurgery in [place].  You have been referred to Lauraine Pickle but you can see any of her colleagues

## 2023-12-06 NOTE — ED Triage Notes (Signed)
 Pt arrives POV from home. C/c back pain radiating into her R leg. Limping visible into triage. Worsening since last week.

## 2023-12-06 NOTE — ED Provider Notes (Signed)
 [facility] AT [facility] Provider Note   CSN: [id number] Arrival date & time: 12/06/23  1359     Patient presents with: Back Pain   Ruth Dixon is a 53 y.o. female.  {Add pertinent medical, surgical, social history, OB history to YEP:67052} Patient complains of back pain and pain radiating down her right leg   Back Pain      Prior to Admission medications   Medication Sig Start Date End Date Taking? Authorizing Provider  oxyCODONE -acetaminophen  (PERCOCET/ROXICET) 5-325 MG tablet Take 1 tablet by mouth every 6 (six) hours as needed for severe pain (pain score 7-10). 12/06/23  Yes Suzette Pac, MD  predniSONE  (DELTASONE ) 10 MG tablet Take 2 tablets (20 mg total) by mouth daily. 12/06/23  Yes Suzette Pac, MD  albuterol  (PROVENTIL ) 2 MG tablet Take 2 mg by mouth 3 (three) times daily.    [provider]  albuterol  (VENTOLIN  HFA) 108 (90 Base) MCG/ACT inhaler Inhale 2 puffs into the lungs every 4 (four) hours as needed. 11/03/23   Stuart Vernell Norris, PA-C  benzonatate  (TESSALON ) 200 MG capsule Take 200 mg by mouth 3 (three) times daily as needed for cough.    [provider]  budesonide-glycopyrrolate-formoterol  (BREZTRI  AEROSPHERE) 160-9-4.8 MCG/ACT AERO inhaler Take 2 puffs first thing in am and then another 2 puffs about 12 hours later. Patient taking differently: Inhale 2 puffs into the lungs 3 (three) times daily. Take 2 puffs first thing in am and then another 2 puffs about 12 hours later. 11/03/23   Stuart Vernell Norris, PA-C  busPIRone  (BUSPAR ) 10 MG tablet Take 5 mg by mouth 3 (three) times daily.    [provider]  busPIRone  (BUSPAR ) 10 MG tablet Take 10 mg by mouth 3 (three) times daily.    [provider]  EPINEPHrine (PRIMATENE MIST) 0.125 MG/ACT AERO Inhale into the lungs. Patient taking differently: Inhale 0.125 mg into the lungs as needed.    [provider]  furosemide (LASIX) 20  MG tablet Take 20 mg by mouth daily.    [provider]  HYDROcodone-acetaminophen  (NORCO) 10-325 MG tablet Take 1 tablet by mouth every 4 (four) hours as needed. 11/19/23   Kayla Jeoffrey RAMAN, FNP  Multiple Vitamin (MULTIVITAMIN) tablet Take 1 tablet by mouth daily.    [provider]  Nirmatrelvir-Ritonavir (PAXLOVID , 300/100, PO) Take by mouth.    [provider]  oseltamivir  (TAMIFLU ) 75 MG capsule Take 75 mg by mouth.    [provider]  promethazine -dextromethorphan (PROMETHAZINE -DM) 6.25-15 MG/5ML syrup Take 5 mLs by mouth 4 (four) times daily as needed. 04/13/23   Leath-Warren, Etta PARAS, NP  rOPINIRole  (REQUIP ) 0.5 MG tablet Take 0.5 mg by mouth at bedtime.    [provider]  zolpidem  (AMBIEN ) 10 MG tablet Take 10 mg by mouth at bedtime as needed. 11/07/22   [provider]    Allergies: Patient has no known allergies.    Review of Systems  Musculoskeletal:  Positive for back pain.    Updated Vital Signs BP 111/79 (BP Location: Right Arm)   Pulse 96   Temp 98.2 F (36.8 C) (Oral)   Resp (!) 21   Ht 5' 8 (1.727 m)   Wt 66.2 kg   LMP 02/21/2017 (Within Weeks)   SpO2 95%   BMI 22.19 kg/m   Physical Exam  (all labs ordered are listed, but only abnormal results are displayed) Labs Reviewed - No data to display  EKG: None  Radiology: DG Lumbar Spine Complete Result Date: 12/06/2023 CLINICAL DATA:  Shortness of breath. Back pain radiating to right leg. EXAM: LUMBAR SPINE - COMPLETE 4+ VIEW COMPARISON:  04/17/2008. FINDINGS: There is no evidence of acute lumbar spine fracture. Grade 1/2 anterolisthesis is present at L5-S1. Mild stairstep retrolisthesis is present at L1-L2, L2-L3, and L3-L4. Multilevel intervertebral disc space narrowing, degenerative endplate changes, and facet arthropathy is noted. Atherosclerotic calcification of the aorta is noted. IMPRESSION: 1. No evidence of acute fracture in the lumbar spine. 2. Grade  1/2 anterolisthesis at L5-S1 and stairstep retrolisthesis in the upper lumbar spine. 3. Multilevel degenerative disc disease and facet arthropathy. Electronically Signed   By: Leita Birmingham M.D.   On: 12/06/2023 16:33    {Document cardiac monitor, telemetry assessment procedure when appropriate:32947} Procedures   Medications Ordered in the ED  HYDROmorphone  (DILAUDID ) injection 1 mg (1 mg Intramuscular Given 12/06/23 1543)  methylPREDNISolone  sodium succinate (SOLU-MEDROL ) 125 mg/2 mL injection 125 mg (125 mg Intramuscular Given 12/06/23 1544)      {Click here for ABCD2, HEART and other calculators REFRESH Note before signing:1}                              Medical Decision Making Amount and/or Complexity of Data Reviewed Radiology: ordered.  Risk Prescription drug management.   Patient with back pain and sciatica on the right leg.  She is given prednisone  and Percocet and will be referred to neurosurgery {Document critical care time when appropriate  Document review of labs and clinical decision tools ie CHADS2VASC2, etc  Document your independent review of radiology images and any outside records  Document your discussion with family members, caretakers and with consultants  Document social determinants of health affecting pt's care  Document your decision making why or why not admission, treatments were needed:32947:::1}   Final diagnoses:  Sciatica of right side    ED Discharge Orders          Ordered    predniSONE  (DELTASONE ) 10 MG tablet  Daily        12/06/23 1742    oxyCODONE -acetaminophen  (PERCOCET/ROXICET) 5-325 MG tablet  Every 6 hours PRN        12/06/23 1742

## 2023-12-16 ENCOUNTER — Other Ambulatory Visit: Payer: Self-pay | Admitting: Family Medicine

## 2023-12-16 NOTE — Telephone Encounter (Unsigned)
 Copied from CRM [id number]. Topic: Clinical - Medication Refill >> Dec 16, 2023  4:43 PM Lonell PEDLAR wrote: Medication:  HYDROcodone-acetaminophen  (NORCO) 10-325 MG tablet    Has the patient contacted their pharmacy? Yes, advised to call pcp (Agent: If no, request that the patient contact the pharmacy for the refill. If patient does not wish to contact the pharmacy document the reason why and proceed with request.) (Agent: If yes, when and what did the pharmacy advise?)  This is the patient's preferred pharmacy:  [facility] [phone] - [place], [place] - [address] AT [facility] [address] [place] [address] Phone: [phone] Fax: [phone]   Is this the correct pharmacy for this prescription? Yes If no, delete pharmacy and type the correct one.   Has the prescription been filled recently? Yes  Is the patient out of the medication? No  Has the patient been seen for an appointment in the last year OR does the patient have an upcoming appointment? Yes  Can we respond through MyChart? Yes  Agent: Please be advised that Rx refills may take up to 3 business days. We ask that you follow-up with your pharmacy.

## 2023-12-18 ENCOUNTER — Encounter: Payer: Self-pay | Admitting: Family Medicine

## 2023-12-18 NOTE — Telephone Encounter (Signed)
 Requested medication (s) are due for refill today: yes  Requested medication (s) are on the active medication list: yes  Last refill:  11/19/23  Future visit scheduled: yes  Notes to clinic:  Unable to refill per protocol, cannot delegate.      Requested Prescriptions  Pending Prescriptions Disp Refills   HYDROcodone-acetaminophen  (NORCO) 10-325 MG tablet 180 tablet 0    Sig: Take 1 tablet by mouth every 4 (four) hours as needed.     Not Delegated - Analgesics:  Opioid Agonist Combinations Failed - 12/18/2023  1:33 PM      Failed - This refill cannot be delegated      Failed - Urine Drug Screen completed in last 360 days      Passed - Valid encounter within last 3 months    Recent Outpatient Visits           1 month ago Encounter to establish care with new provider   [facility] Kayla Jeoffrey RAMAN, FNP

## 2023-12-21 ENCOUNTER — Other Ambulatory Visit (HOSPITAL_COMMUNITY): Payer: Self-pay | Admitting: Neurosurgery

## 2023-12-21 DIAGNOSIS — M5416 Radiculopathy, lumbar region: Secondary | ICD-10-CM

## 2023-12-22 ENCOUNTER — Ambulatory Visit: Admitting: Family Medicine

## 2023-12-22 ENCOUNTER — Encounter: Payer: Self-pay | Admitting: Family Medicine

## 2023-12-22 ENCOUNTER — Telehealth: Payer: Self-pay

## 2023-12-22 VITALS — BP 122/88 | HR 103 | Temp 98.5°F | Ht 68.0 in | Wt 150.8 lb

## 2023-12-22 DIAGNOSIS — M5416 Radiculopathy, lumbar region: Secondary | ICD-10-CM

## 2023-12-22 DIAGNOSIS — F1190 Opioid use, unspecified, uncomplicated: Secondary | ICD-10-CM | POA: Diagnosis not present

## 2023-12-22 NOTE — Progress Notes (Signed)
 Acute Office Visit  Patient ID: MARKESHIA GIEBEL, female    DOB: 15-Dec-1970, 53 y.o.   MRN: [id number]  PCP: Kayla Jeoffrey RAMAN, FNP  Chief Complaint  Patient presents with   Medical Management of Chronic Issues    Medication refills      Subjective:     HPI  Discussed the use of AI scribe software for clinical note transcription with the patient, who gave verbal consent to proceed.  History of Present Illness NEELA ZECCA is a 53 year old female with chronic pain who presents with severe leg and back pain.  She has been experiencing severe pain in her leg, groin, back, and buttock for the past two and a half to three weeks. The pain is described as unbearable and began with tingling, which she attributes to sciatica. The pain has been persistent and severe enough to cause her to visit the emergency room, where she received an x-ray and a shot for inflammation. She was prescribed prednisone , two pills a day for a week, which she has completed.  She has seen a spine doctor, Dr. Lindalee, who ordered an MRI. However, there have been issues with scheduling the MRI due to cost concerns and miscommunication about the appointment location. She is currently in the process of resolving these issues to proceed with the MRI.  Her leg occasionally buckles, causing her to stay close to walls for support and sometimes use a wheelchair to prevent falls. She is concerned about her ability to work and manage her household responsibilities due to the pain.  She is currently taking six tablets of pain medication daily. There is confusion regarding her prescriptions, including a misunderstanding about a prescription for one pill a day for thirty days, which she has not picked up.  She is also dealing with significant stress related to managing her mother's health and finances, which she describes as overwhelming.   Review of Systems  All other systems reviewed and are negative.   Past Medical  History:  Diagnosis Date   Asthma    Degenerative joint disease    Hypertension    IBS (irritable bowel syndrome)    Pleurisy    multiple times   Reflux    Spinal stenosis     Past Surgical History:  Procedure Laterality Date   BIOPSY  01/21/2018   Procedure: BIOPSY;  Surgeon: Shaaron Lamar HERO, MD;  [facility];  Service: Endoscopy;;  esopahgus/GE junction   COLONOSCOPY WITH PROPOFOL  N/A 01/21/2018   Procedure: COLONOSCOPY WITH PROPOFOL ;  Surgeon: Shaaron Lamar HERO, MD;  [facility];  Service: Endoscopy;  Laterality: N/A;  12:45pm   ESOPHAGOGASTRODUODENOSCOPY (EGD) WITH PROPOFOL  N/A 01/21/2018   Procedure: ESOPHAGOGASTRODUODENOSCOPY (EGD) WITH PROPOFOL ;  Surgeon: Shaaron Lamar HERO, MD;  [facility];  Service: Endoscopy;  Laterality: N/A;   HERNIA REPAIR     MALONEY DILATION N/A 01/21/2018   Procedure: AGAPITO DILATION;  Surgeon: Shaaron Lamar HERO, MD;  [facility];  Service: Endoscopy;  Laterality: N/A;    Current Outpatient Medications on File Prior to Visit  Medication Sig Dispense Refill   albuterol  (PROVENTIL ) 2 MG tablet Take 2 mg by mouth 3 (three) times daily.     albuterol  (VENTOLIN  HFA) 108 (90 Base) MCG/ACT inhaler Inhale 2 puffs into the lungs every 4 (four) hours as needed. 18 g 0   benzonatate  (TESSALON ) 200 MG capsule Take 200 mg by mouth 3 (three) times daily as needed for  cough.     budesonide-glycopyrrolate-formoterol  (BREZTRI  AEROSPHERE) 160-9-4.8 MCG/ACT AERO inhaler Take 2 puffs first thing in am and then another 2 puffs about 12 hours later. (Patient taking differently: Inhale 2 puffs into the lungs 3 (three) times daily. Take 2 puffs first thing in am and then another 2 puffs about 12 hours later.) 10.7 g 0   busPIRone  (BUSPAR ) 10 MG tablet Take 5 mg by mouth 3 (three) times daily.     busPIRone  (BUSPAR ) 10 MG tablet Take 10 mg by mouth 3 (three) times daily.     EPINEPHrine (PRIMATENE MIST) 0.125 MG/ACT AERO Inhale into  the lungs. (Patient taking differently: Inhale 0.125 mg into the lungs as needed.)     furosemide (LASIX) 20 MG tablet Take 20 mg by mouth daily.     HYDROcodone-acetaminophen  (NORCO) 10-325 MG tablet Take 1 tablet by mouth every 4 (four) hours as needed. 180 tablet 0   Multiple Vitamin (MULTIVITAMIN) tablet Take 1 tablet by mouth daily.     Nirmatrelvir-Ritonavir (PAXLOVID , 300/100, PO) Take by mouth.     oseltamivir  (TAMIFLU ) 75 MG capsule Take 75 mg by mouth.     predniSONE  (DELTASONE ) 10 MG tablet Take 2 tablets (20 mg total) by mouth daily. 14 tablet 0   promethazine -dextromethorphan (PROMETHAZINE -DM) 6.25-15 MG/5ML syrup Take 5 mLs by mouth 4 (four) times daily as needed. 118 mL 0   rOPINIRole  (REQUIP ) 0.5 MG tablet Take 0.5 mg by mouth at bedtime.     zolpidem  (AMBIEN ) 10 MG tablet Take 10 mg by mouth at bedtime as needed.     No current facility-administered medications on file prior to visit.    No Known Allergies     Objective:    BP 122/88   Pulse (!) 103   Temp 98.5 F (36.9 C)   Ht 5' 8 (1.727 m)   Wt 150 lb 12.8 oz (68.4 kg)   LMP 02/21/2017 (Within Weeks)   SpO2 95%   BMI 22.93 kg/m    Physical Exam Vitals and nursing note reviewed.  Constitutional:      Appearance: Normal appearance. She is normal weight.  HENT:     Head: Normocephalic and atraumatic.  Skin:    General: Skin is warm and dry.  Neurological:     General: No focal deficit present.     Mental Status: She is alert and oriented to person, place, and time. Mental status is at baseline.  Psychiatric:        Mood and Affect: Mood normal. Affect is tearful.        Behavior: Behavior normal.        Thought Content: Thought content normal.        Judgment: Judgment normal.       No results found for any visits on 12/22/23.     Assessment & Plan:   Problem List Items Addressed This Visit     Lumbar radiculopathy - Primary   Chronic, continuous use of opioids    Assessment and  Plan Assessment & Plan Lumbar intervertebral disc degeneration with radiculopathy and chronic pain syndrome Chronic lumbar disc degeneration with radiculopathy causing significant pain. Recent exacerbation managed with prednisone . MRI pending. Current high-dose opioids raise safety concerns. Discussed alternative pain management options. She declined surgical intervention. - Continue MRI scheduling at [facility] in [place]. - Reduce opioid dosage by 10% monthly until pain management referral is established. - Consider referral to [facility] for comprehensive medical and pain management. - Discuss with family  the potential need to reduce opioid and benzodiazepine use. - Schedule follow-up in one month to reassess pain management and medication adjustments.    No orders of the defined types were placed in this encounter.   Return in about 4 weeks (around 01/19/2024) for pain management.  Jeoffrey GORMAN Barrio, FNP [facility]

## 2023-12-22 NOTE — Telephone Encounter (Signed)
 Copied from CRM [id number]. Topic: Clinical - Prescription Issue >> Dec 21, 2023  5:01 PM Nathanel BROCKS wrote: Reason for CRM: HYDROcodone-acetaminophen  (NORCO) 10-325 MG tablet  Pt called and is wanting to know what is going on with this medication. She is out of her medication. She is also aware that she needs to go to a pain management. She wants you to know that she went to the ER and a rx for oxycodone  was prescribed but she did not pick that up. She advised the dr of this.  Pt is wanting to know when her rx will be filled by you so that she can go get it. Please advise pt of this matter.  Please all her spouse Christopher at [phone] and advise. She can't have a phone at her work.

## 2023-12-23 MED ORDER — HYDROCODONE-ACETAMINOPHEN 10-325 MG PO TABS: 1.0000 | ORAL_TABLET | Freq: Four times a day (QID) | ORAL | 0 refills | Status: DC | PRN

## 2023-12-23 NOTE — Addendum Note (Signed)
 Addended by: KAYLA JEOFFREY RAMAN on: 12/23/2023 07:56 AM   Modules accepted: Orders

## 2023-12-24 ENCOUNTER — Telehealth: Payer: Self-pay | Admitting: Family Medicine

## 2023-12-24 NOTE — Telephone Encounter (Signed)
 Copied from CRM #[id number]. Topic: General - Other >> Dec 24, 2023  9:38 AM Avram MATSU wrote: Reason for CRM: Venetia is calling to speak with someone about his fiance, she having mychart pain management issues. Please advise [phone]

## 2023-12-26 ENCOUNTER — Encounter (HOSPITAL_COMMUNITY): Payer: Self-pay

## 2023-12-26 ENCOUNTER — Ambulatory Visit (HOSPITAL_COMMUNITY)

## 2023-12-28 NOTE — Telephone Encounter (Signed)
 Called and spoke to Ruth Dixon. He was told that they are on a wait list due to the pain management office being behind on scheduling.

## 2023-12-30 ENCOUNTER — Telehealth: Payer: Self-pay | Admitting: Family Medicine

## 2023-12-30 NOTE — Telephone Encounter (Signed)
 Copied from CRM [id number]. Topic: General - Other >> Dec 30, 2023  4:08 PM Jasmin G wrote: Reason for CRM: Pt called regarding recent missed call from Ms. Pomeroy, Jesusa R, CMA, called [facility] but she was not available. Call pt back at [phone].

## 2024-01-05 ENCOUNTER — Other Ambulatory Visit: Payer: Self-pay | Admitting: Family Medicine

## 2024-01-05 ENCOUNTER — Encounter: Admitting: Physician Assistant

## 2024-01-05 ENCOUNTER — Encounter: Admitting: Family Medicine

## 2024-01-05 DIAGNOSIS — G8929 Other chronic pain: Secondary | ICD-10-CM

## 2024-01-07 ENCOUNTER — Ambulatory Visit
Admission: EM | Admit: 2024-01-07 | Discharge: 2024-01-07 | Disposition: A | Discharge status: Home / Self Care | Attending: Nurse Practitioner | Admitting: Nurse Practitioner

## 2024-01-07 ENCOUNTER — Ambulatory Visit: Admitting: Family Medicine

## 2024-01-07 DIAGNOSIS — J441 Chronic obstructive pulmonary disease with (acute) exacerbation: Secondary | ICD-10-CM | POA: Diagnosis not present

## 2024-01-07 MED ORDER — PREDNISONE 20 MG PO TABS: 40.0000 mg | ORAL_TABLET | Freq: Every day | ORAL | 0 refills | Status: AC

## 2024-01-07 MED ORDER — DOXYCYCLINE HYCLATE 100 MG PO CAPS: 100.0000 mg | ORAL_CAPSULE | Freq: Two times a day (BID) | ORAL | 0 refills | Status: AC

## 2024-01-07 MED ORDER — DEXAMETHASONE SOD PHOSPHATE PF 10 MG/ML IJ SOLN
10.0000 mg | Freq: Once | INTRAMUSCULAR | Status: AC
  Administered 2024-01-07: 10 mg via INTRAMUSCULAR

## 2024-01-07 MED ORDER — BENZONATATE 100 MG PO CAPS: 100.0000 mg | ORAL_CAPSULE | Freq: Three times a day (TID) | ORAL | 0 refills | Status: AC | PRN

## 2024-01-07 NOTE — ED Provider Notes (Signed)
 [facility]    CSN: [id number] Arrival date & time: 01/07/24  1527      History   Chief Complaint No chief complaint on file.   HPI Ruth Dixon is a 53 y.o. female.   Patient presents today with 2-day history of bodyaches, congested cough, shortness of breath, wheezing, runny and stuffy nose, sore throat, tooth pain, headache, decreased appetite, and fatigue.  She denies known fevers, chest pain or tightness, ear pain, abdominal pain, nausea/vomiting, and diarrhea.  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  Has taken Tylenol , DuoNeb, albuterol  inhaler without much improvement.  Reports she is currently unable to afford her Breztri  inhaler due to insurance not wanting to pay for it at the end of the year.    Past Medical History:  Diagnosis Date   Asthma    Degenerative joint disease    Hypertension    IBS (irritable bowel syndrome)    Pleurisy    multiple times   Reflux    Spinal stenosis     Patient Active Problem List   Diagnosis Date Noted   Lumbar radiculopathy 12/22/2023   Chronic, continuous use of opioids 12/22/2023   Cigarette nicotine  dependence without complication 11/12/2023   Encounter to establish care with new provider 11/12/2023   Other chronic pain 11/12/2023   Degeneration of intervertebral disc of lumbar region with discogenic back pain and lower extremity pain 11/12/2023   Hypertension 11/12/2023   Asthmatic bronchitis , chronic (HCC) 01/08/2023   Anxiety 11/15/2022   Restless leg 11/15/2022   GERD (gastroesophageal reflux disease) 01/06/2018    Past Surgical History:  Procedure Laterality Date   BIOPSY  01/21/2018   Procedure: BIOPSY;  Surgeon: Shaaron Lamar HERO, MD;  [facility];  Service: Endoscopy;;  esopahgus/GE junction   COLONOSCOPY WITH PROPOFOL  N/A 01/21/2018   Procedure: COLONOSCOPY WITH PROPOFOL ;  Surgeon: Shaaron Lamar HERO, MD;  [facility];  Service: Endoscopy;   Laterality: N/A;  12:45pm   ESOPHAGOGASTRODUODENOSCOPY (EGD) WITH PROPOFOL  N/A 01/21/2018   Procedure: ESOPHAGOGASTRODUODENOSCOPY (EGD) WITH PROPOFOL ;  Surgeon: Shaaron Lamar HERO, MD;  [facility];  Service: Endoscopy;  Laterality: N/A;   HERNIA REPAIR     MALONEY DILATION N/A 01/21/2018   Procedure: AGAPITO DILATION;  Surgeon: Shaaron Lamar HERO, MD;  [facility];  Service: Endoscopy;  Laterality: N/A;    OB History   No obstetric history on file.      Home Medications    Prior to Admission medications   Medication Sig Start Date End Date Taking? Authorizing Provider  benzonatate  (TESSALON ) 100 MG capsule Take 1 capsule (100 mg total) by mouth 3 (three) times daily as needed for cough. Do not take with alcohol or while operating or driving heavy machinery 87/5/74  Yes Chandra Raisin A, NP  doxycycline  (VIBRAMYCIN ) 100 MG capsule Take 1 capsule (100 mg total) by mouth 2 (two) times daily for 7 days. 01/07/24 01/14/24 Yes Chandra Raisin LABOR, NP  predniSONE  (DELTASONE ) 20 MG tablet Take 2 tablets (40 mg total) by mouth daily with breakfast for 5 days. 01/07/24 01/12/24 Yes Chandra Raisin LABOR, NP  albuterol  (PROVENTIL ) 2 MG tablet Take 2 mg by mouth 3 (three) times daily.    [provider]  albuterol  (VENTOLIN  HFA) 108 (90 Base) MCG/ACT inhaler Inhale 2 puffs into the lungs every 4 (four) hours as needed. 11/03/23   Stuart Vernell Norris, PA-C  budesonide-glycopyrrolate-formoterol  (BREZTRI  AEROSPHERE) 160-9-4.8 MCG/ACT  AERO inhaler Take 2 puffs first thing in am and then another 2 puffs about 12 hours later. Patient taking differently: Inhale 2 puffs into the lungs 3 (three) times daily. Take 2 puffs first thing in am and then another 2 puffs about 12 hours later. 11/03/23   Stuart Vernell Norris, PA-C  busPIRone  (BUSPAR ) 10 MG tablet Take 5 mg by mouth 3 (three) times daily.    [provider]  busPIRone  (BUSPAR ) 10 MG tablet Take 10 mg by mouth 3  (three) times daily.    [provider]  EPINEPHrine (PRIMATENE MIST) 0.125 MG/ACT AERO Inhale into the lungs. Patient taking differently: Inhale 0.125 mg into the lungs as needed.    [provider]  furosemide (LASIX) 20 MG tablet Take 20 mg by mouth daily.    [provider]  HYDROcodone -acetaminophen  (NORCO) 10-325 MG tablet Take 1 tablet by mouth every 6 (six) hours as needed. 12/23/23   Kayla Jeoffrey RAMAN, FNP  Multiple Vitamin (MULTIVITAMIN) tablet Take 1 tablet by mouth daily.    [provider]  Nirmatrelvir-Ritonavir (PAXLOVID , 300/100, PO) Take by mouth.    [provider]  rOPINIRole  (REQUIP ) 0.5 MG tablet Take 0.5 mg by mouth at bedtime.    [provider]  zolpidem  (AMBIEN ) 10 MG tablet Take 10 mg by mouth at bedtime as needed. 11/07/22   [provider]    Family History Family History  Problem Relation Age of Onset   Congestive Heart Failure Mother    Diabetes Maternal Grandmother    COPD Maternal Grandmother    Stroke Maternal Grandmother    COPD Maternal Grandfather    COPD Paternal Grandmother    COPD Paternal Grandfather    Colon cancer Neg Hx     Social History Social History   Tobacco Use   Smoking status: Every Day    Current packs/day: 1.00    Types: Cigarettes   Smokeless tobacco: Never  Vaping Use   Vaping status: Never Used  Substance Use Topics   Alcohol use: Not Currently   Drug use: No     Allergies   Patient has no known allergies.   Review of Systems Review of Systems Per HPI  Physical Exam Triage Vital Signs ED Triage Vitals  Encounter Vitals Group     BP 01/07/24 1537 113/83     Girls Systolic BP Percentile --      Girls Diastolic BP Percentile --      Boys Systolic BP Percentile --      Boys Diastolic BP Percentile --      Pulse Rate 01/07/24 1537 93     Resp 01/07/24 1537 20     Temp 01/07/24 1537 98.1 F (36.7 C)     Temp Source 01/07/24 1537 Oral     SpO2  01/07/24 1537 94 %     Weight --      Height --      Head Circumference --      Peak Flow --      Pain Score 01/07/24 1536 0     Pain Loc --      Pain Education --      Exclude from Growth Chart --    No data found.  Updated Vital Signs BP 113/83 (BP Location: Right Arm)   Pulse 93   Temp 98.1 F (36.7 C) (Oral)   Resp 20   LMP 02/21/2017 (Within Weeks)   SpO2 94%   Visual Acuity Right Eye  Distance:   Left Eye Distance:   Bilateral Distance:    Right Eye Near:   Left Eye Near:    Bilateral Near:     Physical Exam Vitals and nursing note reviewed.  Constitutional:      General: She is not in acute distress.    Appearance: Normal appearance. She is ill-appearing. She is not toxic-appearing.  HENT:     Head: Normocephalic and atraumatic.     Right Ear: Tympanic membrane, ear canal and external ear normal.     Left Ear: Tympanic membrane, ear canal and external ear normal.     Nose: Congestion present. No rhinorrhea.     Mouth/Throat:     Mouth: Mucous membranes are moist.     Pharynx: Oropharynx is clear. Postnasal drip present. No oropharyngeal exudate or posterior oropharyngeal erythema.  Eyes:     General: No scleral icterus.    Extraocular Movements: Extraocular movements intact.  Cardiovascular:     Rate and Rhythm: Normal rate and regular rhythm.  Pulmonary:     Effort: Pulmonary effort is normal. No respiratory distress.     Breath sounds: Wheezing present. No rhonchi or rales.  Musculoskeletal:     Cervical back: Normal range of motion and neck supple.  Lymphadenopathy:     Cervical: No cervical adenopathy.  Skin:    General: Skin is warm and dry.     Coloration: Skin is not jaundiced or pale.     Findings: No erythema or rash.  Neurological:     Mental Status: She is alert and oriented to person, place, and time.  Psychiatric:        Behavior: Behavior is cooperative.      UC Treatments / Results  Labs (all labs ordered are listed, but only  abnormal results are displayed) Labs Reviewed - No data to display  EKG   Radiology No results found.  Procedures Procedures (including critical care time)  Medications Ordered in UC Medications  dexamethasone  (DECADRON ) injection 10 mg (10 mg Intramuscular Given 01/07/24 1614)    Initial Impression / Assessment and Plan / UC Course  I have reviewed the triage vital signs and the nursing notes.  Pertinent labs & imaging results that were available during my care of the patient were reviewed by me and considered in my medical decision making (see chart for details).   Patient is a very pleasant, ill-appearing, but nontoxic  53 year old female.  On exam, vital signs are stable and patient has intertwine expiratory wheezing, congestion, postnasal drainage.  Suspect COPD exacerbation caused by viral URI.  She is currently out of Breztri  inhaler due to insurance not paying at the end of the year.  Will treat for COPD exacerbation with scheduled nebulizers or albuterol  inhaler, IM Decadron  today in urgent care, start oral prednisone  tomorrow, antitussives, guaifenesin, and doxycycline  to cover for atypical infection.  ER and return precautions were discussed with the patient.  Work excuse was provided.  The patient was given the opportunity to ask questions.  All questions answered to their satisfaction.  The patient is in agreement to this plan.   Final Clinical Impressions(s) / UC Diagnoses   Final diagnoses:  COPD exacerbation [facility])     Discharge Instructions      You are having an exacerbation of COPD most likely due to a viral infection and being out of your COPD medicine.  Start using the albuterol  inhaler OR duoneb breathing treatments every 4-6 hours scheduled for the next 2 days,  then use as needed.  Start taking the oral prednisone  to help with lung inflammation.  In addition, recommend taking doxycycline  to treat for atypical infection.  Symptoms should improve over the next  few days.  You can also take the Tessalon  perles as needed for coughing.  If symptoms worsen despite treatment, please return for reevaluation or follow-up in the ER.     ED Prescriptions     Medication Sig Dispense Auth. Provider   predniSONE  (DELTASONE ) 20 MG tablet Take 2 tablets (40 mg total) by mouth daily with breakfast for 5 days. 10 tablet Chandra Raisin A, NP   benzonatate  (TESSALON ) 100 MG capsule Take 1 capsule (100 mg total) by mouth 3 (three) times daily as needed for cough. Do not take with alcohol or while operating or driving heavy machinery 21 capsule Chandra Raisin A, NP   doxycycline  (VIBRAMYCIN ) 100 MG capsule Take 1 capsule (100 mg total) by mouth 2 (two) times daily for 7 days. 14 capsule Chandra Raisin LABOR, NP      PDMP not reviewed this encounter.   Chandra Raisin LABOR, NP 01/07/24 [phone]

## 2024-01-07 NOTE — ED Triage Notes (Signed)
 Pt reports she has chest congestion., fatigue cough and chest discomfort x 2 days   Took tylenol  neb tx, and inhaler

## 2024-01-07 NOTE — Discharge Instructions (Signed)
 You are having an exacerbation of COPD most likely due to a viral infection and being out of your COPD medicine.  Start using the albuterol  inhaler OR duoneb breathing treatments every 4-6 hours scheduled for the next 2 days, then use as needed.  Start taking the oral prednisone  to help with lung inflammation.  In addition, recommend taking doxycycline  to treat for atypical infection.  Symptoms should improve over the next few days.  You can also take the Tessalon  perles as needed for coughing.  If symptoms worsen despite treatment, please return for reevaluation or follow-up in the ER.

## 2024-01-15 ENCOUNTER — Ambulatory Visit: Admitting: Physician Assistant

## 2024-01-20 ENCOUNTER — Telehealth: Payer: Self-pay

## 2024-01-20 NOTE — Telephone Encounter (Signed)
 [facility] faxed in referral confirmation for pain management appt. On 01/13/2024 Pt. Scheduled on 02/18/2024

## 2024-01-25 ENCOUNTER — Other Ambulatory Visit: Payer: Self-pay | Admitting: Family Medicine

## 2024-01-25 MED ORDER — HYDROCODONE-ACETAMINOPHEN 10-325 MG PO TABS: 1.0000 | ORAL_TABLET | Freq: Four times a day (QID) | ORAL | 0 refills | Status: DC | PRN

## 2024-01-25 NOTE — Telephone Encounter (Unsigned)
 Copied from CRM #[id number]. Topic: Clinical - Medication Refill >> Jan 25, 2024 10:15 AM Vanessa G wrote: Medication: HYDROcodone -acetaminophen  (NORCO) 10-325 MG tablet  Has the patient contacted their pharmacy? Yes, referred to provider (Agent: If no, request that the patient contact the pharmacy for the refill. If patient does not wish to contact the pharmacy document the reason why and proceed with request.) (Agent: If yes, when and what did the pharmacy advise?)  This is the patient's preferred pharmacy:  [facility] [phone] - [place], [place] - [address] AT [facility] [address] [place] [address] Phone: [phone] Fax: [phone]   Is this the correct pharmacy for this prescription? Yes If no, delete pharmacy and type the correct one.   Has the prescription been filled recently? No  Is the patient out of the medication? Yes  Has the patient been seen for an appointment in the last year OR does the patient have an upcoming appointment? Yes  Can we respond through MyChart? Yes  Agent: Please be advised that Rx refills may take up to 3 business days. We ask that you follow-up with your pharmacy.

## 2024-01-26 ENCOUNTER — Telehealth: Payer: Self-pay

## 2024-01-26 NOTE — Telephone Encounter (Signed)
 Copied from CRM [id number]. Topic: Clinical - Prescription Issue >> Jan 26, 2024 10:05 AM Treva T wrote: Reason for CRM: Pt significant other, Christopher, is calling on behalf of pt, states pharmacy needs prescription written to indicate pt to take 1 tablet every 4 hours instead of every 6 hours, as per pharmacy.  Medication: HYDROcodone -acetaminophen  (NORCO) 10-325 MG tablet  Caller states pt has been without medication since 01/22/24, and needs prescription corrected, and resubmitted, so that medication can be picked up today.  Requesting a return call when this has been updated.   Christopher return ph. [phone]   [facility] [phone] - [place],  - [address] AT [facility] OF [address] [place] [address] Phone: [phone] Fax: [phone]

## 2024-02-01 ENCOUNTER — Encounter: Admitting: Family Medicine

## 2024-02-01 ENCOUNTER — Other Ambulatory Visit: Payer: Self-pay | Admitting: Family Medicine

## 2024-02-01 MED ORDER — HYDROCODONE-ACETAMINOPHEN 10-325 MG PO TABS: 1.0000 | ORAL_TABLET | ORAL | 0 refills | Status: AC | PRN

## 2024-02-02 NOTE — Progress Notes (Signed)
Erroneous, patient cancelled

## 2024-02-07 ENCOUNTER — Ambulatory Visit
Admission: EM | Admit: 2024-02-07 | Discharge: 2024-02-07 | Disposition: A | Discharge status: Home / Self Care | Attending: Nurse Practitioner | Admitting: Nurse Practitioner

## 2024-02-07 DIAGNOSIS — J441 Chronic obstructive pulmonary disease with (acute) exacerbation: Secondary | ICD-10-CM | POA: Diagnosis not present

## 2024-02-07 MED ORDER — PREDNISONE 20 MG PO TABS: 40.0000 mg | ORAL_TABLET | Freq: Every day | ORAL | 0 refills | Status: AC

## 2024-02-07 MED ORDER — PROMETHAZINE-DM 6.25-15 MG/5ML PO SYRP: 5.0000 mL | ORAL_SOLUTION | Freq: Four times a day (QID) | ORAL | 0 refills | Status: AC | PRN

## 2024-02-07 MED ORDER — AMOXICILLIN-POT CLAVULANATE 875-125 MG PO TABS: 1.0000 | ORAL_TABLET | Freq: Two times a day (BID) | ORAL | 0 refills | Status: AC

## 2024-02-07 MED ORDER — METHYLPREDNISOLONE SODIUM SUCC 125 MG IJ SOLR
125.0000 mg | Freq: Once | INTRAMUSCULAR | Status: AC
  Administered 2024-02-07: 125 mg via INTRAMUSCULAR

## 2024-02-07 NOTE — ED Triage Notes (Addendum)
 Pt states SOB,cough,chest tightness for the past 6 days.  States she has been taking OTC cold medicine at home and breathing treatments and using he inhaler. States she had a negative home  covid and flu test.

## 2024-02-07 NOTE — ED Provider Notes (Signed)
 " [facility]    CSN: [id number] Arrival date & time: 02/07/24  1447      History   Chief Complaint Chief Complaint  Patient presents with   Shortness of Breath    HPI Ruth Dixon is a 53 y.o. female.   The history is provided by the patient.   Patient presents with a 6-day history of cough, wheezing, shortness of breath, and chest tightness.  Patient denies fever, chills, headache, ear pain, abdominal pain, nausea, vomiting, diarrhea, or rash.  Patient reports underlying history of asthma and COPD.  Patient is a current smoker.  States that she has been using breathing treatments and her inhaler at home.  States that she had a negative COVID and flu test at work.  Patient declines a breathing treatment, states that she just performed 1 prior to arriving to this appointment.  Past Medical History:  Diagnosis Date   Asthma    Degenerative joint disease    Hypertension    IBS (irritable bowel syndrome)    Pleurisy    multiple times   Reflux    Spinal stenosis     Patient Active Problem List   Diagnosis Date Noted   Lumbar radiculopathy 12/22/2023   Chronic, continuous use of opioids 12/22/2023   Cigarette nicotine  dependence without complication 11/12/2023   Encounter to establish care with new provider 11/12/2023   Other chronic pain 11/12/2023   Degeneration of intervertebral disc of lumbar region with discogenic back pain and lower extremity pain 11/12/2023   Hypertension 11/12/2023   Asthmatic bronchitis , chronic (HCC) 01/08/2023   Anxiety 11/15/2022   Restless leg 11/15/2022   GERD (gastroesophageal reflux disease) 01/06/2018    Past Surgical History:  Procedure Laterality Date   BIOPSY  01/21/2018   Procedure: BIOPSY;  Surgeon: Shaaron Lamar HERO, MD;  Location: [facility];  Service: Endoscopy;;  esopahgus/GE junction   COLONOSCOPY WITH PROPOFOL  N/A 01/21/2018   Procedure: COLONOSCOPY WITH PROPOFOL ;  Surgeon: Shaaron Lamar HERO, MD;  Location:  [facility];  Service: Endoscopy;  Laterality: N/A;  12:45pm   ESOPHAGOGASTRODUODENOSCOPY (EGD) WITH PROPOFOL  N/A 01/21/2018   Procedure: ESOPHAGOGASTRODUODENOSCOPY (EGD) WITH PROPOFOL ;  Surgeon: Shaaron Lamar HERO, MD;  Location: [facility];  Service: Endoscopy;  Laterality: N/A;   HERNIA REPAIR     MALONEY DILATION N/A 01/21/2018   Procedure: AGAPITO DILATION;  Surgeon: Shaaron Lamar HERO, MD;  Location: [facility];  Service: Endoscopy;  Laterality: N/A;    OB History   No obstetric history on file.      Home Medications    Prior to Admission medications  Medication Sig Start Date End Date Taking? Authorizing Provider  amoxicillin -clavulanate (AUGMENTIN ) 875-125 MG tablet Take 1 tablet by mouth every 12 (twelve) hours. 02/07/24  Yes Leath-Warren, Etta PARAS, NP  predniSONE  (DELTASONE ) 20 MG tablet Take 2 tablets (40 mg total) by mouth daily with breakfast for 5 days. 02/07/24 02/12/24 Yes Leath-Warren, Etta PARAS, NP  promethazine -dextromethorphan (PROMETHAZINE -DM) 6.25-15 MG/5ML syrup Take 5 mLs by mouth 4 (four) times daily as needed. 02/07/24  Yes Leath-Warren, Etta PARAS, NP  albuterol  (PROVENTIL ) 2 MG tablet Take 2 mg by mouth 3 (three) times daily.    [provider]  albuterol  (VENTOLIN  HFA) 108 (90 Base) MCG/ACT inhaler Inhale 2 puffs into the lungs every 4 (four) hours as needed. 11/03/23   Stuart Vernell Norris, PA-C  benzonatate  (TESSALON ) 100 MG capsule Take 1 capsule (100 mg total) by mouth 3 (three) times  daily as needed for cough. Do not take with alcohol or while operating or driving heavy machinery 87/5/74   Chandra Harlene LABOR, NP  budesonide-glycopyrrolate-formoterol  (BREZTRI  AEROSPHERE) 160-9-4.8 MCG/ACT AERO inhaler Take 2 puffs first thing in am and then another 2 puffs about 12 hours later. Patient taking differently: Inhale 2 puffs into the lungs 3 (three) times daily. Take 2 puffs first thing in am and then another 2 puffs about 12 hours later. 11/03/23   Stuart Vernell Norris, PA-C  busPIRone  (BUSPAR ) 10 MG tablet Take 5 mg by mouth 3 (three) times daily.    [provider]  busPIRone  (BUSPAR ) 10 MG tablet Take 10 mg by mouth 3 (three) times daily.    [provider]  EPINEPHrine (PRIMATENE MIST) 0.125 MG/ACT AERO Inhale into the lungs. Patient taking differently: Inhale 0.125 mg into the lungs as needed.    [provider]  furosemide (LASIX) 20 MG tablet Take 20 mg by mouth daily.    [provider]  HYDROcodone -acetaminophen  (NORCO) 10-325 MG tablet Take 1 tablet by mouth every 4 (four) hours as needed for severe pain (pain score 7-10). 02/01/24   Kayla Jeoffrey RAMAN, FNP  Multiple Vitamin (MULTIVITAMIN) tablet Take 1 tablet by mouth daily.    [provider]  Nirmatrelvir-Ritonavir (PAXLOVID , 300/100, PO) Take by mouth.    [provider]  rOPINIRole  (REQUIP ) 0.5 MG tablet Take 0.5 mg by mouth at bedtime.    [provider]  zolpidem  (AMBIEN ) 10 MG tablet Take 10 mg by mouth at bedtime as needed. 11/07/22   [provider]    Family History Family History  Problem Relation Age of Onset   Congestive Heart Failure Mother    Diabetes Maternal Grandmother    COPD Maternal Grandmother    Stroke Maternal Grandmother    COPD Maternal Grandfather    COPD Paternal Grandmother    COPD Paternal Grandfather    Colon cancer Neg Hx     Social History Social History[1]   Allergies   Patient has no known allergies.   Review of Systems Review of Systems Per HPI  Physical Exam Triage Vital Signs ED Triage Vitals  Encounter Vitals Group     BP 02/07/24 1503 122/83     Girls Systolic BP Percentile --      Girls Diastolic BP Percentile --      Boys Systolic BP Percentile --      Boys Diastolic BP Percentile --      Pulse Rate 02/07/24 1503 (!) 108     Resp 02/07/24 1503 20     Temp 02/07/24 1503 98.3 F (36.8 C)     Temp Source 02/07/24 1503 Oral     SpO2 02/07/24 1503  90 %     Weight --      Height --      Head Circumference --      Peak Flow --      Pain Score 02/07/24 1501 0     Pain Loc --      Pain Education --      Exclude from Growth Chart --    No data found.  Updated Vital Signs BP 122/83 (BP Location: Right Arm)   Pulse (!) 108   Temp 98.3 F (36.8 C) (Oral)   Resp 20   LMP 02/21/2017   SpO2 95%   Visual Acuity Right Eye Distance:   Left Eye Distance:   Bilateral Distance:    Right Eye Near:  Left Eye Near:    Bilateral Near:     Physical Exam Vitals and nursing note reviewed.  Constitutional:      General: She is not in acute distress.    Appearance: She is well-developed.  HENT:     Head: Normocephalic.     Right Ear: Tympanic membrane, ear canal and external ear normal.     Left Ear: Tympanic membrane, ear canal and external ear normal.     Nose: Nose normal.     Mouth/Throat:     Mouth: Mucous membranes are moist.  Eyes:     Extraocular Movements: Extraocular movements intact.     Conjunctiva/sclera: Conjunctivae normal.     Pupils: Pupils are equal, round, and reactive to light.  Cardiovascular:     Rate and Rhythm: Regular rhythm. Tachycardia present.     Pulses: Normal pulses.     Heart sounds: Normal heart sounds.  Pulmonary:     Effort: Pulmonary effort is normal. No respiratory distress.     Breath sounds: No stridor. Wheezing present. No rhonchi or rales.  Abdominal:     General: Bowel sounds are normal.     Palpations: Abdomen is soft.  Musculoskeletal:     Cervical back: Normal range of motion.  Skin:    General: Skin is warm and dry.  Neurological:     General: No focal deficit present.     Mental Status: She is alert and oriented to person, place, and time.  Psychiatric:        Mood and Affect: Mood normal.        Behavior: Behavior normal.      UC Treatments / Results  Labs (all labs ordered are listed, but only abnormal results are displayed) Labs Reviewed - No data to  display  EKG   Radiology No results found.  Procedures Procedures (including critical care time)  Medications Ordered in UC Medications  methylPREDNISolone  sodium succinate (SOLU-MEDROL ) 125 mg/2 mL injection 125 mg (125 mg Intramuscular Given 02/07/24 1533)    Initial Impression / Assessment and Plan / UC Course  I have reviewed the triage vital signs and the nursing notes.  Pertinent labs & imaging results that were available during my care of the patient were reviewed by me and considered in my medical decision making (see chart for details).  Patient presents for complaints of wheezing, shortness of breath, and cough.  Symptoms have been present for the past 6 days.  On exam, lung sounds are clear throughout, baseline O2 sats at 90%.  Patient declined breathing treatment.  Solu-Medrol  125 mg IM administered.  Symptoms consistent with viral URI with cough.  Will treat with prednisone  40 mg for the next 5 days and Augmentin  875/125 mg to cover for possible bacterial etiology.  Promethazine  DM prescribed for the cough.  Supportive care recommendations were provided and discussed with the patient to include fluids, rest, over-the-counter analgesics, and smoking cessation.  Recheck of O2 sat at time of discharge, O2 sat at 95%.  Discussed strict ER follow-up precautions with the patient.  Recommend follow-up with PCP within the next 7 to 10 days for reevaluation.  Patient was in agreement with this plan of care and verbalized understanding.  All questions were answered.  Patient stable for discharge.   Final Clinical Impressions(s) / UC Diagnoses   Final diagnoses:  COPD exacerbation (HCC)     Discharge Instructions      You were given an injection of Solu-Medrol  125 mg.  Start  the prednisone  tomorrow. Take medication as prescribed. Increase fluids and allow for plenty of rest. You may take over-the-counter Tylenol  as needed for pain, fever, or general discomfort. Recommend the use  of a humidifier in your bedroom at nighttime during sleep and sleeping elevated on pillows while symptoms persist. Go to the emergency department if you experience worsening wheezing, shortness of breath, difficulty breathing, or other concerns. Recommend follow-up with your primary care physician within the next 7 to 10 days for reevaluation. Follow-up as needed.     ED Prescriptions     Medication Sig Dispense Auth. Provider   amoxicillin -clavulanate (AUGMENTIN ) 875-125 MG tablet Take 1 tablet by mouth every 12 (twelve) hours. 14 tablet Leath-Warren, Etta PARAS, NP   predniSONE  (DELTASONE ) 20 MG tablet Take 2 tablets (40 mg total) by mouth daily with breakfast for 5 days. 10 tablet Leath-Warren, Etta PARAS, NP   promethazine -dextromethorphan (PROMETHAZINE -DM) 6.25-15 MG/5ML syrup Take 5 mLs by mouth 4 (four) times daily as needed. 118 mL Leath-Warren, Etta PARAS, NP      PDMP not reviewed this encounter.    [1]  Social History Tobacco Use   Smoking status: Every Day    Current packs/day: 1.00    Types: Cigarettes   Smokeless tobacco: Never  Vaping Use   Vaping status: Never Used  Substance Use Topics   Alcohol use: Not Currently   Drug use: No     Gilmer Etta PARAS, NP 02/07/24 1540  "

## 2024-02-07 NOTE — Discharge Instructions (Addendum)
 You were given an injection of Solu-Medrol  125 mg.  Start the prednisone  tomorrow. Take medication as prescribed. Increase fluids and allow for plenty of rest. You may take over-the-counter Tylenol  as needed for pain, fever, or general discomfort. Recommend the use of a humidifier in your bedroom at nighttime during sleep and sleeping elevated on pillows while symptoms persist. Go to the emergency department if you experience worsening wheezing, shortness of breath, difficulty breathing, or other concerns. Recommend follow-up with your primary care physician within the next 7 to 10 days for reevaluation. Follow-up as needed.

## 2024-02-11 NOTE — Telephone Encounter (Signed)
 Contacted pharmacy. They received both . Had them DC the 6 hour script. And fill only the 4 hour script.

## 2024-02-12 ENCOUNTER — Other Ambulatory Visit: Payer: Self-pay

## 2024-02-12 MED ORDER — FUROSEMIDE 20 MG PO TABS: 20.0000 mg | ORAL_TABLET | Freq: Every day | ORAL | 1 refills | Status: AC

## 2024-02-19 ENCOUNTER — Other Ambulatory Visit: Payer: Self-pay

## 2024-02-19 NOTE — Telephone Encounter (Signed)
 Sent message to provider for review.

## 2024-02-22 ENCOUNTER — Other Ambulatory Visit: Payer: Self-pay | Admitting: Family Medicine
